# Patient Record
Sex: Female | Born: 1970 | Race: Black or African American | Hispanic: No | Smoking: Never smoker
Health system: Southern US, Community
[De-identification: ages and names within clinical notes are randomized; demographics above are authoritative.]

## PROBLEM LIST (undated history)

## (undated) DIAGNOSIS — I10 Essential (primary) hypertension: Secondary | ICD-10-CM

## (undated) HISTORY — PX: TONSILLECTOMY: SUR1361

## (undated) HISTORY — DX: Essential (primary) hypertension: I10

---

## 2000-12-03 ENCOUNTER — Other Ambulatory Visit: Admission: RE | Admit: 2000-12-03 | Discharge: 2000-12-03 | Payer: Self-pay | Admitting: Obstetrics and Gynecology

## 2001-02-03 ENCOUNTER — Encounter: Payer: Self-pay | Admitting: Emergency Medicine

## 2001-02-03 ENCOUNTER — Emergency Department (HOSPITAL_COMMUNITY): Admission: EM | Admit: 2001-02-03 | Discharge: 2001-02-03 | Payer: Self-pay | Admitting: Emergency Medicine

## 2001-07-28 ENCOUNTER — Encounter: Payer: Self-pay | Admitting: Family Medicine

## 2001-07-28 ENCOUNTER — Ambulatory Visit (HOSPITAL_COMMUNITY): Admission: RE | Admit: 2001-07-28 | Discharge: 2001-07-28 | Payer: Self-pay | Admitting: Family Medicine

## 2002-12-08 ENCOUNTER — Emergency Department (HOSPITAL_COMMUNITY): Admission: EM | Admit: 2002-12-08 | Discharge: 2002-12-08 | Payer: Self-pay | Admitting: Emergency Medicine

## 2004-01-30 ENCOUNTER — Emergency Department (HOSPITAL_COMMUNITY): Admission: EM | Admit: 2004-01-30 | Discharge: 2004-01-30 | Payer: Self-pay | Admitting: Emergency Medicine

## 2007-10-19 ENCOUNTER — Other Ambulatory Visit: Admission: RE | Admit: 2007-10-19 | Discharge: 2007-10-19 | Payer: Self-pay | Admitting: Obstetrics and Gynecology

## 2007-11-06 ENCOUNTER — Ambulatory Visit (HOSPITAL_COMMUNITY): Admission: RE | Admit: 2007-11-06 | Discharge: 2007-11-06 | Payer: Self-pay | Admitting: Orthopedic Surgery

## 2007-11-06 ENCOUNTER — Encounter: Payer: Self-pay | Admitting: Orthopedic Surgery

## 2009-02-09 ENCOUNTER — Other Ambulatory Visit: Admission: RE | Admit: 2009-02-09 | Discharge: 2009-02-09 | Payer: Self-pay | Admitting: Obstetrics and Gynecology

## 2009-03-13 ENCOUNTER — Ambulatory Visit: Payer: Self-pay | Admitting: Orthopedic Surgery

## 2009-03-13 DIAGNOSIS — M7020 Olecranon bursitis, unspecified elbow: Secondary | ICD-10-CM

## 2009-03-23 ENCOUNTER — Ambulatory Visit (HOSPITAL_COMMUNITY): Admission: RE | Admit: 2009-03-23 | Discharge: 2009-03-23 | Payer: Self-pay | Admitting: Obstetrics & Gynecology

## 2009-04-27 ENCOUNTER — Ambulatory Visit (HOSPITAL_COMMUNITY): Admission: RE | Admit: 2009-04-27 | Discharge: 2009-04-27 | Payer: Self-pay | Admitting: Obstetrics & Gynecology

## 2009-05-05 ENCOUNTER — Ambulatory Visit (HOSPITAL_COMMUNITY): Admission: RE | Admit: 2009-05-05 | Discharge: 2009-05-05 | Payer: Self-pay | Admitting: Obstetrics & Gynecology

## 2009-06-08 ENCOUNTER — Ambulatory Visit (HOSPITAL_COMMUNITY): Admission: RE | Admit: 2009-06-08 | Discharge: 2009-06-08 | Payer: Self-pay | Admitting: Obstetrics & Gynecology

## 2009-06-29 ENCOUNTER — Ambulatory Visit (HOSPITAL_COMMUNITY): Admission: RE | Admit: 2009-06-29 | Discharge: 2009-06-29 | Payer: Self-pay | Admitting: Obstetrics & Gynecology

## 2009-07-27 ENCOUNTER — Ambulatory Visit (HOSPITAL_COMMUNITY): Admission: RE | Admit: 2009-07-27 | Discharge: 2009-07-27 | Payer: Self-pay | Admitting: Obstetrics & Gynecology

## 2009-07-28 ENCOUNTER — Ambulatory Visit: Payer: Self-pay | Admitting: Family Medicine

## 2009-07-28 ENCOUNTER — Inpatient Hospital Stay (HOSPITAL_COMMUNITY): Admission: AD | Admit: 2009-07-28 | Discharge: 2009-07-28 | Payer: Self-pay | Admitting: Obstetrics & Gynecology

## 2009-08-16 ENCOUNTER — Ambulatory Visit (HOSPITAL_COMMUNITY): Admission: RE | Admit: 2009-08-16 | Discharge: 2009-08-16 | Payer: Self-pay | Admitting: Obstetrics & Gynecology

## 2009-09-11 ENCOUNTER — Ambulatory Visit: Payer: Self-pay | Admitting: Obstetrics & Gynecology

## 2009-09-11 ENCOUNTER — Inpatient Hospital Stay (HOSPITAL_COMMUNITY): Admission: AD | Admit: 2009-09-11 | Discharge: 2009-09-13 | Payer: Self-pay | Admitting: Obstetrics & Gynecology

## 2009-09-18 ENCOUNTER — Ambulatory Visit: Payer: Self-pay | Admitting: Family Medicine

## 2009-09-18 ENCOUNTER — Inpatient Hospital Stay (HOSPITAL_COMMUNITY): Admission: AD | Admit: 2009-09-18 | Discharge: 2009-09-21 | Payer: Self-pay | Admitting: Obstetrics and Gynecology

## 2010-09-16 ENCOUNTER — Encounter: Payer: Self-pay | Admitting: Obstetrics & Gynecology

## 2010-11-11 LAB — CREATININE CLEARANCE, URINE, 24 HOUR
Collection Interval-CRCL: 24 hours
Creatinine Clearance: 177 mL/min — ABNORMAL HIGH (ref 75–115)
Creatinine, Urine: 98.4 mg/dL
Creatinine: 0.58 mg/dL (ref 0.40–1.20)
Urine Total Volume-CRCL: 1500 mL

## 2010-11-11 LAB — PROTEIN, URINE, 24 HOUR
Collection Interval-UPROT: 24 hours
Protein, 24H Urine: 165 mg/d — ABNORMAL HIGH (ref 50–100)
Protein, Urine: 11 mg/dL
Urine Total Volume-UPROT: 1500 mL

## 2010-11-11 LAB — COMPREHENSIVE METABOLIC PANEL
ALT: 18 U/L (ref 0–35)
AST: 23 U/L (ref 0–37)
BUN: 5 mg/dL — ABNORMAL LOW (ref 6–23)
CO2: 26 mEq/L (ref 19–32)
Calcium: 8.9 mg/dL (ref 8.4–10.5)
Creatinine, Ser: 0.58 mg/dL (ref 0.4–1.2)
GFR calc non Af Amer: >60 mL/min (ref >60)
Glucose, Bld: 88 mg/dL (ref 70–99)
Sodium: 134 mEq/L — ABNORMAL LOW (ref 135–145)
Total Bilirubin: 0.5 mg/dL (ref 0.3–1.2)

## 2010-11-11 LAB — URIC ACID: Uric Acid, Serum: 5.2 mg/dL (ref 2.4–7.0)

## 2010-11-11 LAB — URINALYSIS, ROUTINE W REFLEX MICROSCOPIC
Glucose, UA: NEGATIVE mg/dL
Nitrite: NEGATIVE
Specific Gravity, Urine: 1.01 (ref 1.005–1.030)
pH: 8 (ref 5.0–8.0)

## 2010-11-11 LAB — CBC
Hemoglobin: 11.4 g/dL — ABNORMAL LOW (ref 12.0–15.0)
MCHC: 33.7 g/dL (ref 30.0–36.0)
Platelets: 255 10*3/uL (ref 150–400)
WBC: 7.4 10*3/uL (ref 4.0–10.5)

## 2010-11-11 LAB — URINE MICROSCOPIC-ADD ON

## 2010-11-27 LAB — URINALYSIS, ROUTINE W REFLEX MICROSCOPIC
Bilirubin Urine: NEGATIVE
Nitrite: NEGATIVE
Protein, ur: NEGATIVE mg/dL
Specific Gravity, Urine: 1.01 (ref 1.005–1.030)
pH: 7 (ref 5.0–8.0)

## 2010-11-27 LAB — WET PREP, GENITAL
Clue Cells Wet Prep HPF POC: NONE SEEN
Yeast Wet Prep HPF POC: NONE SEEN

## 2011-01-11 NOTE — Procedures (Signed)
   NAME:  Tara Rios, Tara Rios                          ACCOUNT NO.:  [id number]   MEDICAL RECORD NO.:  [id number]                   PATIENT TYPE:  EMS   LOCATION:  ED                                   FACILITY:  [facility]   PHYSICIAN:  Edward L. Juanetta Gosling, M.D.             DATE OF BIRTH:  01/09/1971   DATE OF PROCEDURE:  DATE OF DISCHARGE:  12/08/2002                                EKG INTERPRETATION   FINDINGS:  The rhythm is sinus rhythm with a tachycardic rate of about 101.   IMPRESSION:  Otherwise normal electrocardiogram.                                               Edward L. Juanetta Gosling, M.D.    ELH/MEDQ  D:  12/09/2002  T:  12/10/2002  Job:  [id number]

## 2011-01-14 ENCOUNTER — Encounter: Payer: Self-pay | Admitting: Obstetrics & Gynecology

## 2011-01-14 NOTE — Progress Notes (Unsigned)
  Tara Rios presents today for an ultrasound to evaluate placement of her intrauterine device/Mirena and evaluate for the presence of a left ovarian cyst. Her last ultrasound at the time of her IUD placement serendipitously revealed a 5 cm left ovarian cyst which appeared simple.  The patient states she has been having back pain associated with this as well as continuous daily bleeding really from the time she delivered her last child which has not abated since the placement of her IUD.  sound is performed today transvaginally and reveals a normal-sized uterus with the Mirena appropriately in the endometrial cavity. The right ovary reveals several small follicular cysts less than 10 mm, which are completely normal. The left ovary and the posterior and lay on top of the left hypogastric artery and vein at this point it appears to be a deflating cyst as it is somewhat tubular but does not appear to be a simple hydrosalpinx of the left lip into area and today it measures 3.66 x 3.2 cm has a thickened rim but a simple internal with no heterogeneous echoes.  There is no free fluid in the pelvis.  Impression  #1  dysfunctional uterine bleeding menometrorrhagia:  I'm going to place on estradiol tablets 2 mg daily to try and update her bleeding  #2  left ovarian cyst appears simple and potentially is involuting. I will see her back in 3 months to reevaluate with sonogram. I am hopeful it would shrink further and resolve.  However it persists we may use hormonal suppression with megestrol

## 2011-03-22 IMAGING — US US FETAL BPP W/O NONSTRESS
1 series · 14 of 28 positions shown · non-contrast
Comparison: none

OBSTETRICAL ULTRASOUND:
 This ultrasound exam was performed in the [HOSPITAL] Ultrasound Department.  The OB US report was generated in the AS system, and faxed to the ordering physician.  This report is also available in [HOSPITAL]?s Access[employer] and in [REDACTED] PACS.

[Series 1: us ob follow up · 0.25mm/px · 14 of 43 slices shown]
[im 2/43]
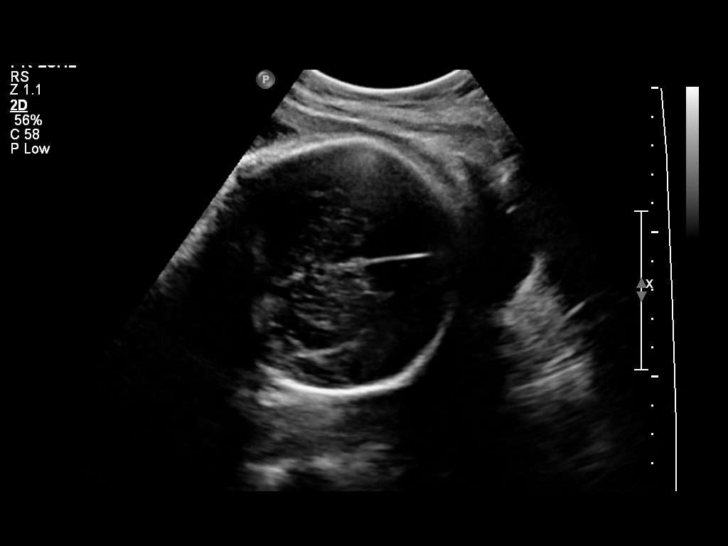
[im 5/43]
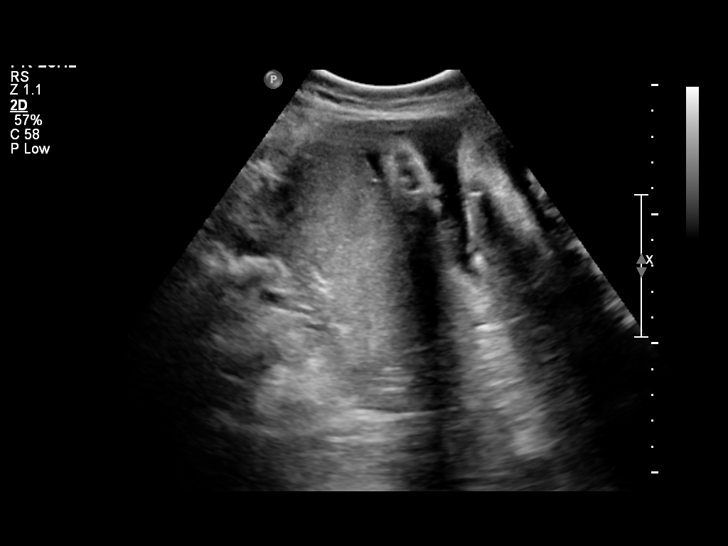
[im 8/43]
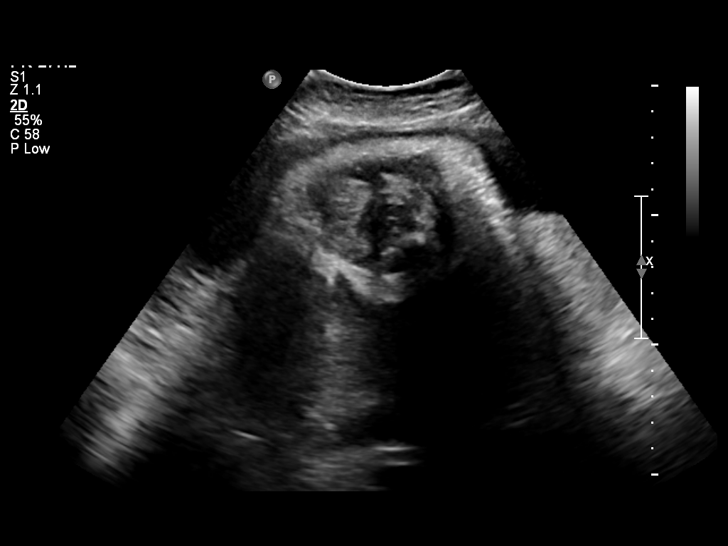
[im 11/43]
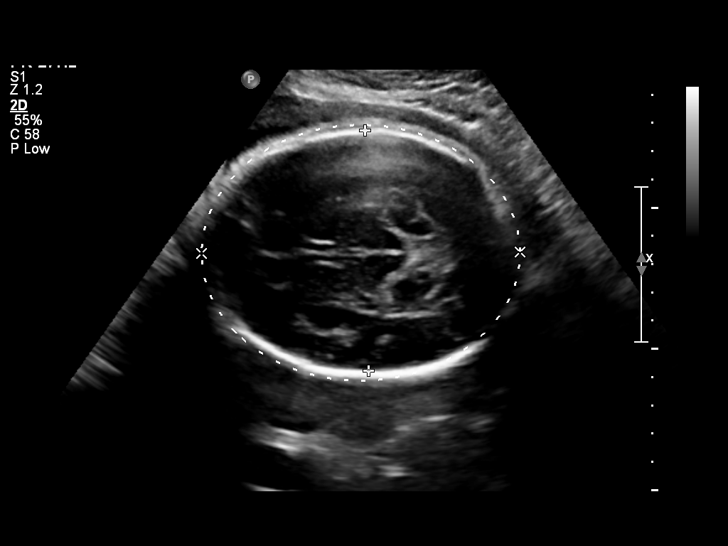
[im 15/43]
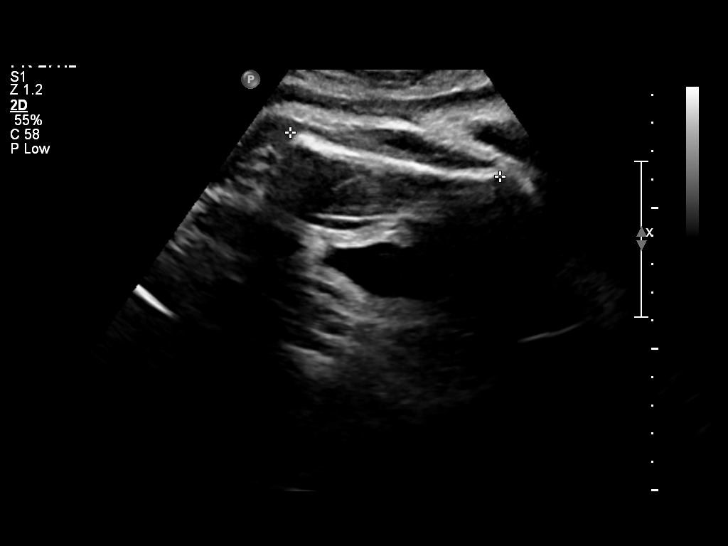
[im 18/43]
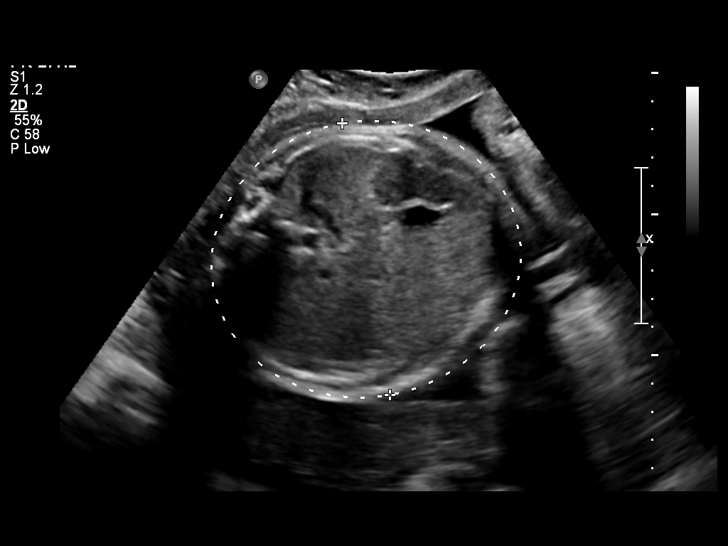
[im 21/43]
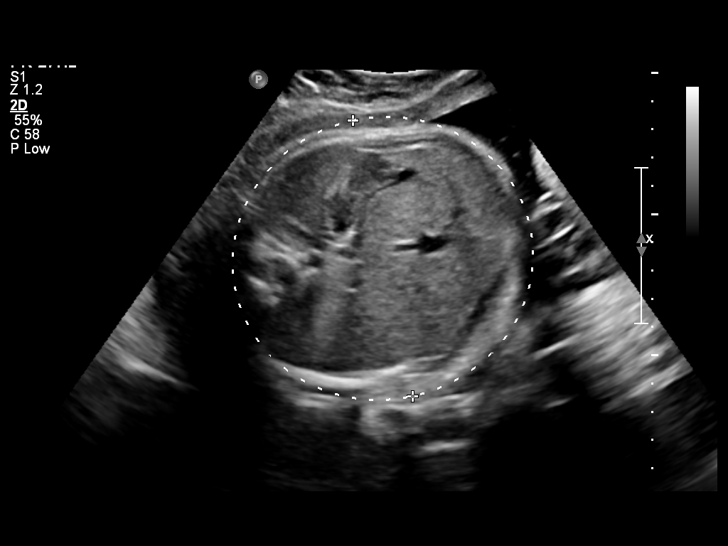
[im 24/43]
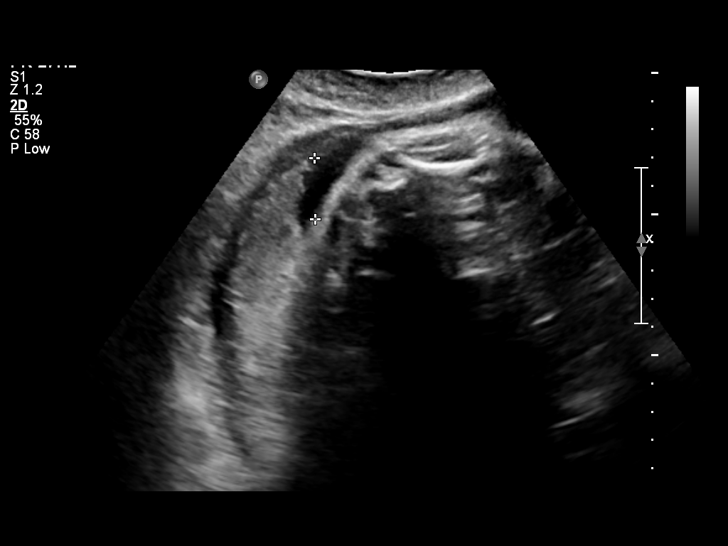
[im 27/43]
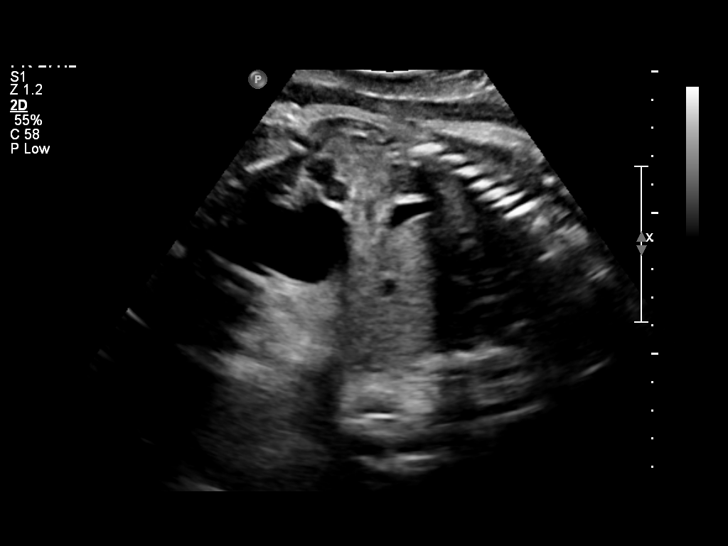
[im 30/43]
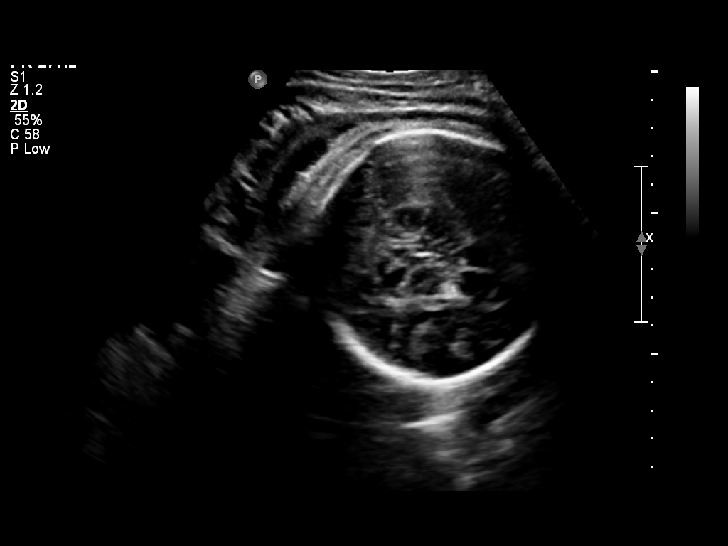
[im 33/43]
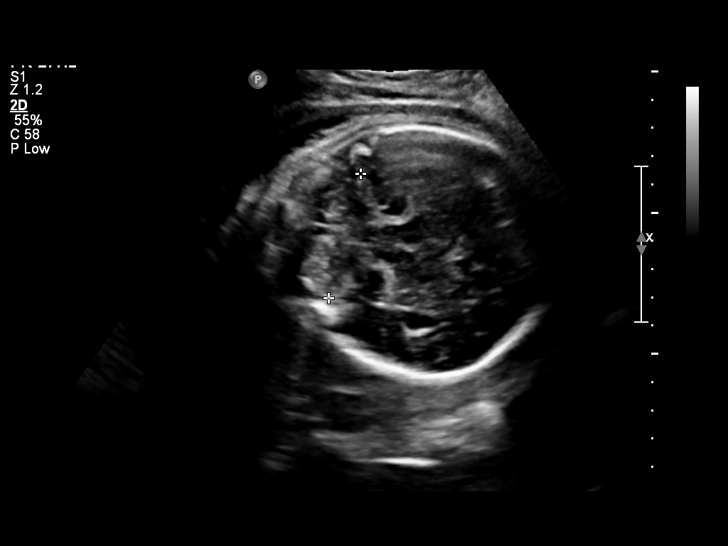
[im 36/43]
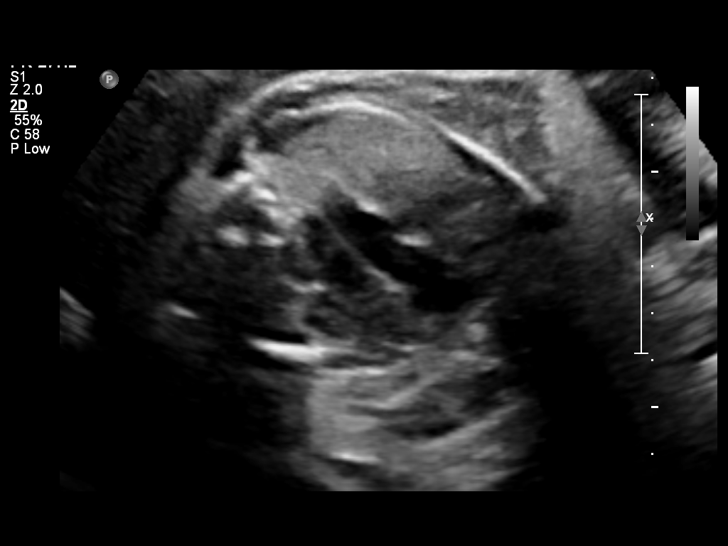
[im 39/43]
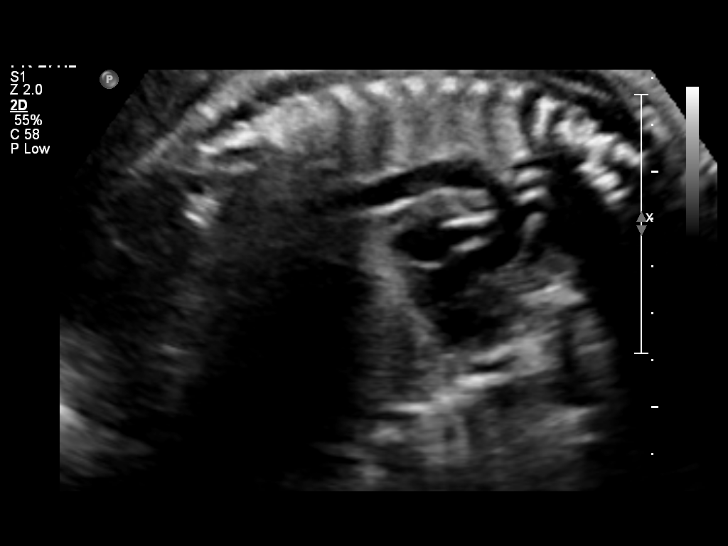
[im 43/43]
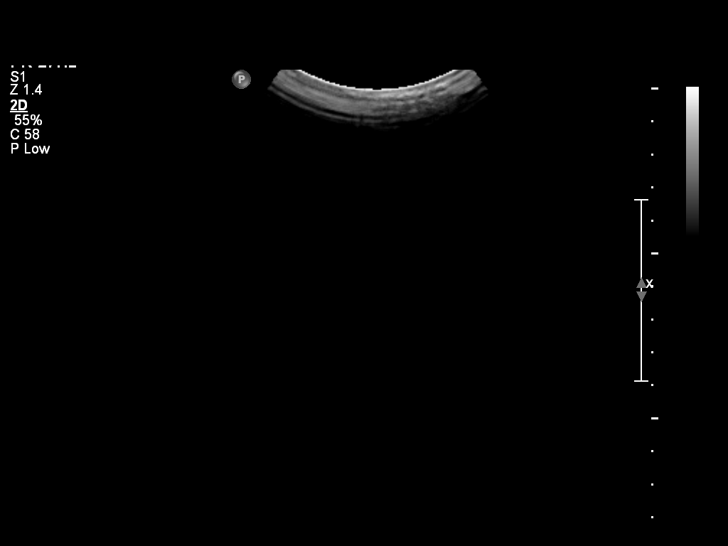

[14 of 28 positions shown; findings below may reference images not displayed]

IMPRESSION: See AS Obstetric US report.

## 2011-06-05 ENCOUNTER — Other Ambulatory Visit (HOSPITAL_COMMUNITY)
Admission: RE | Admit: 2011-06-05 | Discharge: 2011-06-05 | Disposition: A | Discharge status: Home / Self Care | Payer: 59 | Source: Ambulatory Visit | Attending: Obstetrics and Gynecology | Admitting: Obstetrics and Gynecology

## 2011-06-05 ENCOUNTER — Other Ambulatory Visit: Payer: Self-pay | Admitting: Obstetrics & Gynecology

## 2011-06-05 DIAGNOSIS — Z01419 Encounter for gynecological examination (general) (routine) without abnormal findings: Secondary | ICD-10-CM | POA: Insufficient documentation

## 2011-06-13 IMAGING — US US OB TRANSVAGINAL
1 series · 14 of 21 positions shown · non-contrast
Comparison: none

OBSTETRICAL ULTRASOUND:
 This ultrasound was performed in The [HOSPITAL], and the AS OB/GYN report will be stored to [REDACTED] PACS.

[Series 1: us ob transvaginal · 14 of 21 slices shown]
[im 1/21]
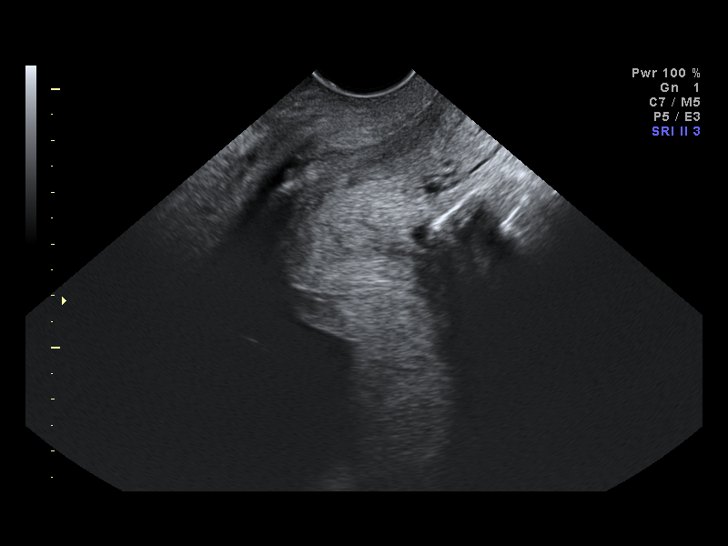
[im 3/21]
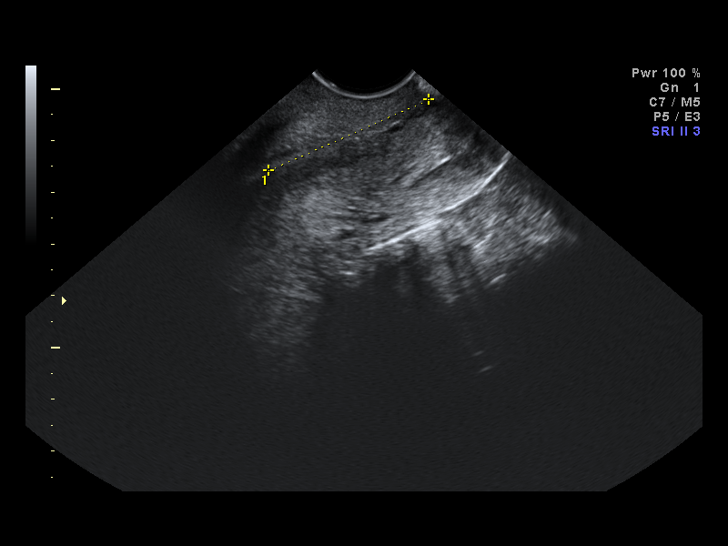
[im 4/21]
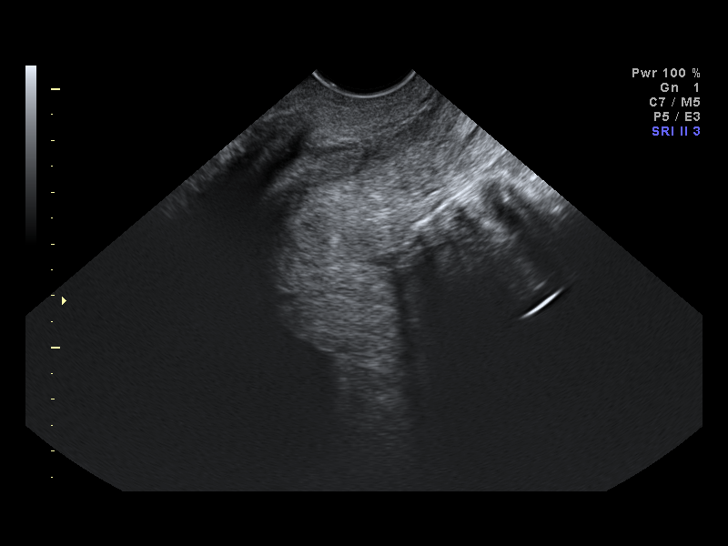
[im 6/21]
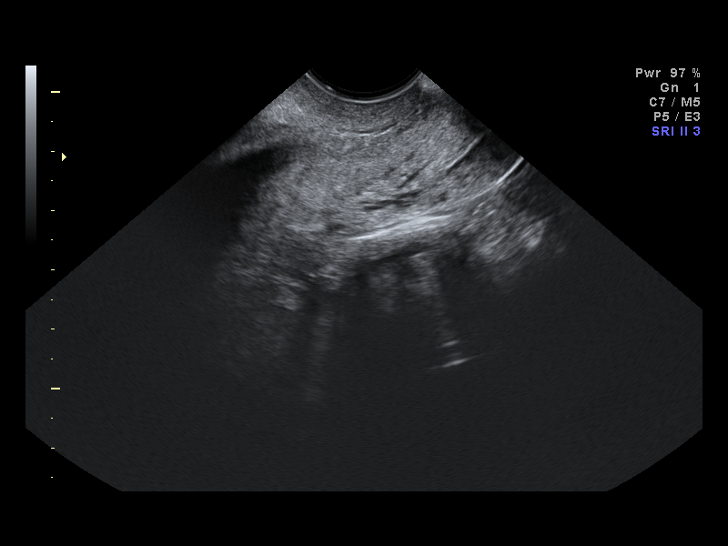
[im 7/21]
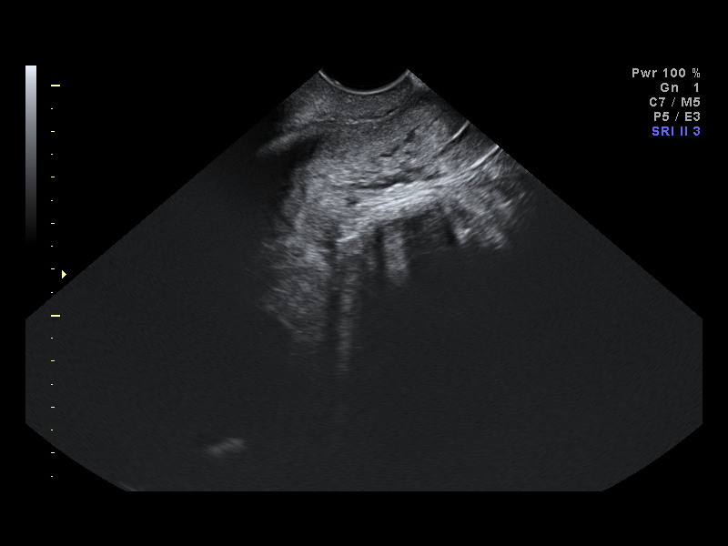
[im 9/21]
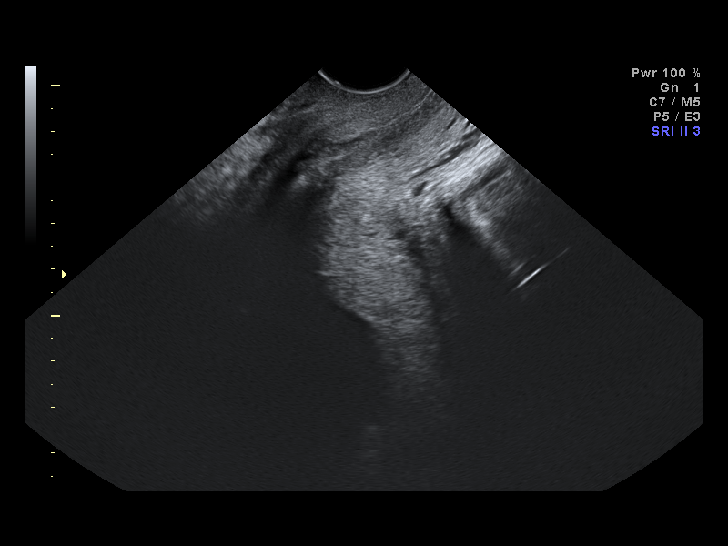
[im 10/21]
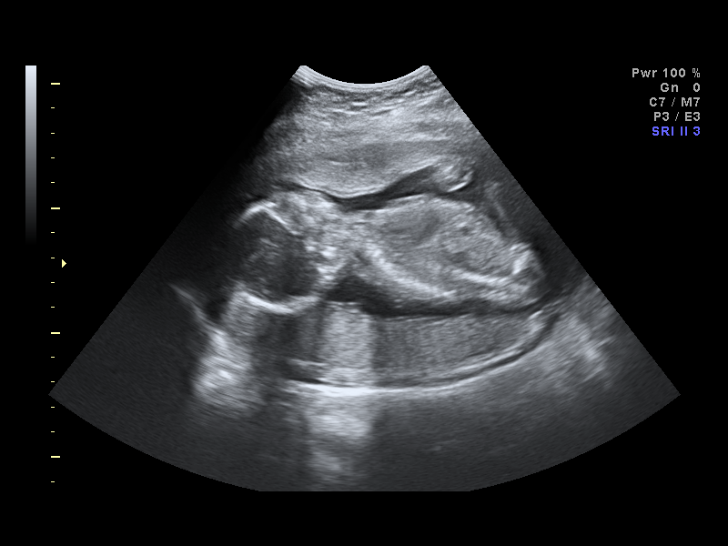
[im 12/21]
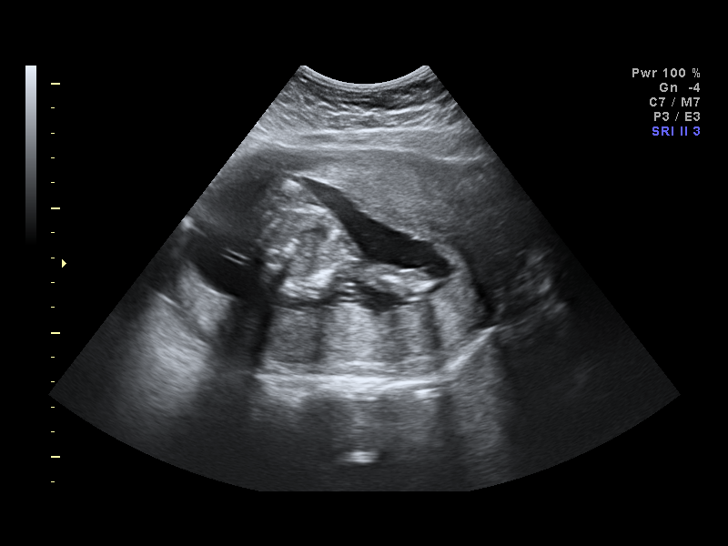
[im 13/21]
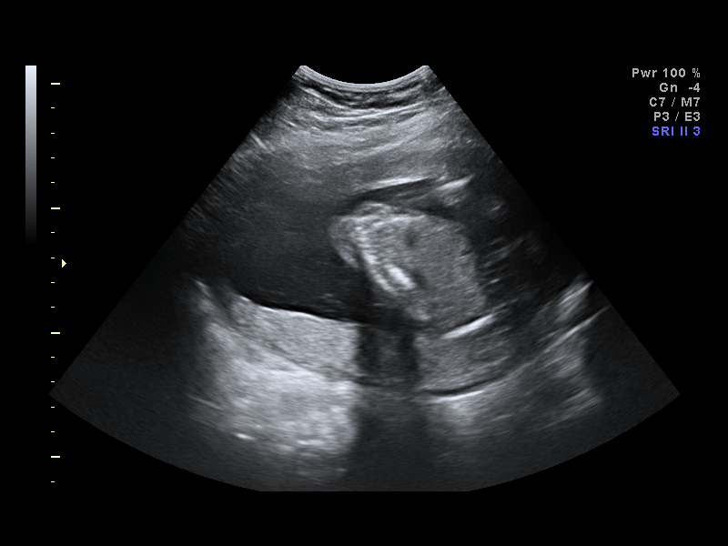
[im 15/21]
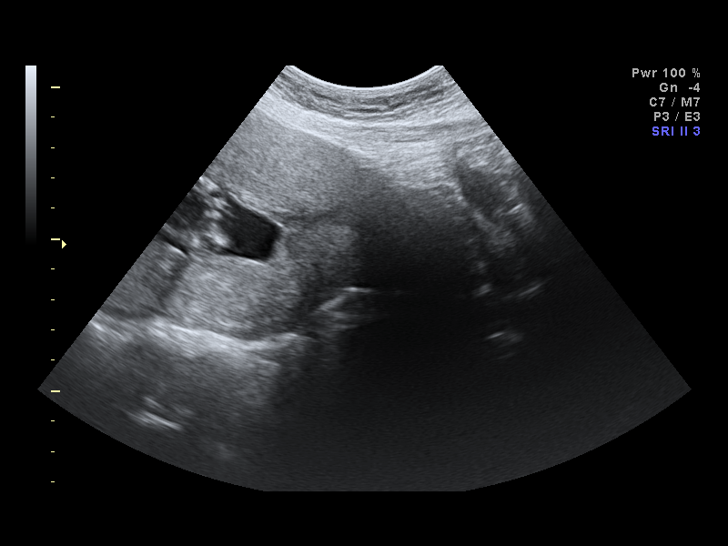
[im 16/21]
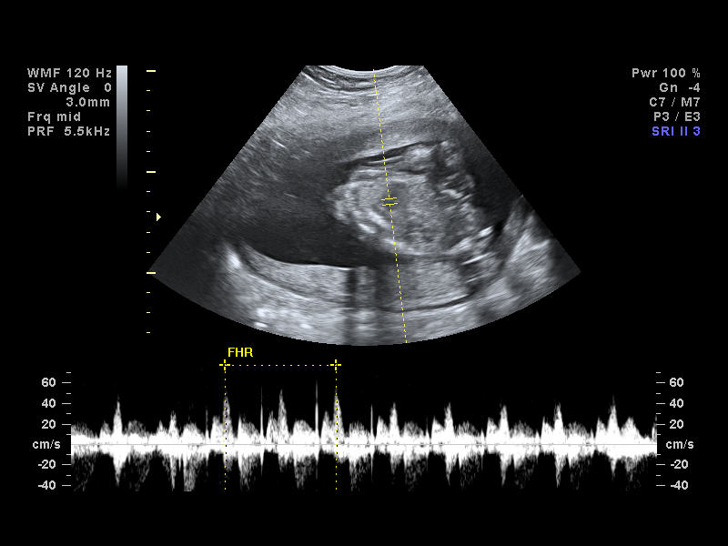
[im 18/21]
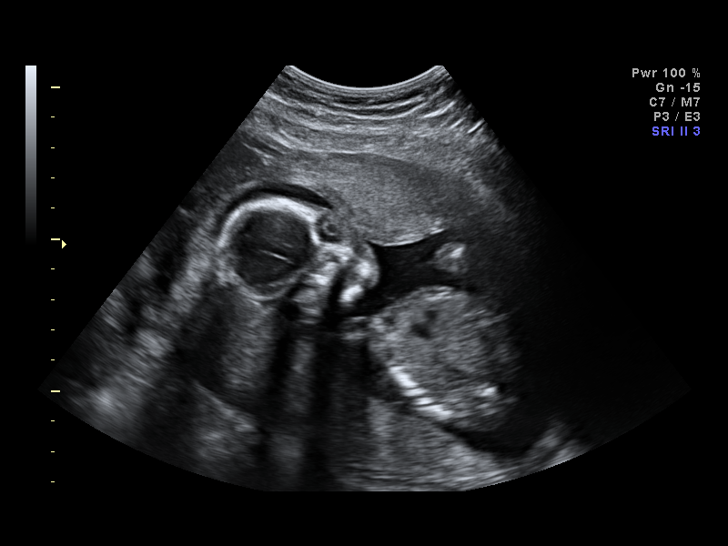
[im 19/21]
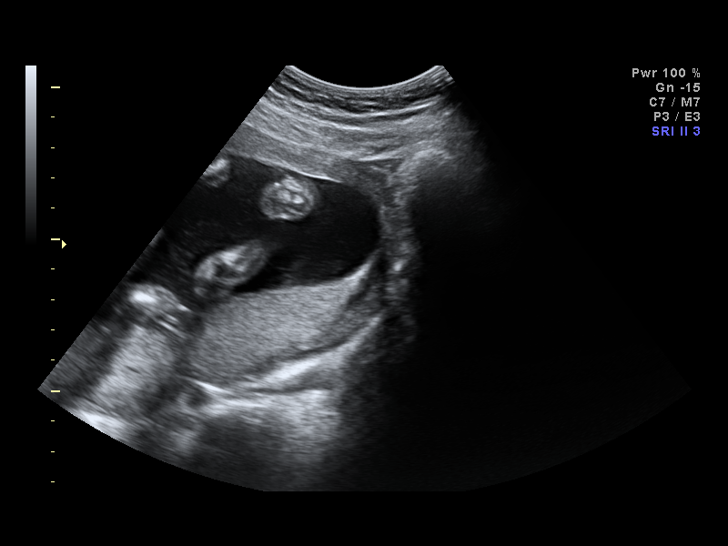
[im 21/21]
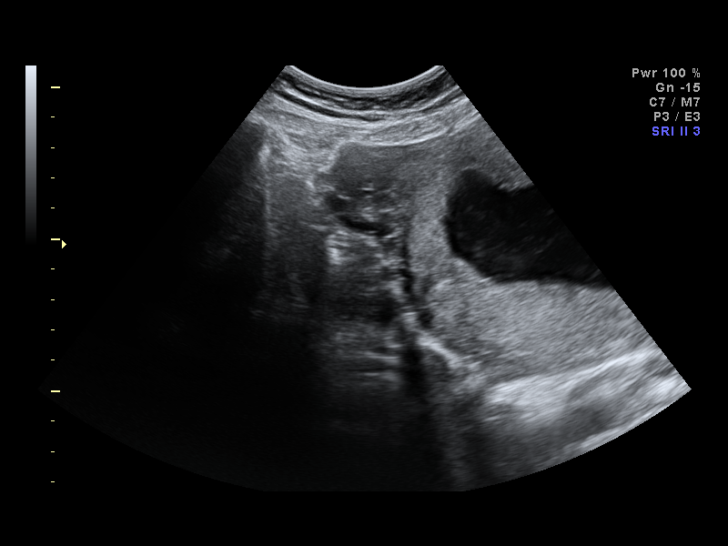

[14 of 21 positions shown; findings below may reference images not displayed]

IMPRESSION: AS OB/GYN has also been faxed to the ordering physician.

## 2011-07-04 ENCOUNTER — Other Ambulatory Visit (HOSPITAL_COMMUNITY): Payer: Self-pay | Admitting: Family Medicine

## 2011-07-04 DIAGNOSIS — Z139 Encounter for screening, unspecified: Secondary | ICD-10-CM

## 2011-07-11 ENCOUNTER — Ambulatory Visit (HOSPITAL_COMMUNITY)
Admission: RE | Admit: 2011-07-11 | Discharge: 2011-07-11 | Disposition: A | Discharge status: Home / Self Care | Payer: 59 | Source: Ambulatory Visit | Attending: Family Medicine | Admitting: Family Medicine

## 2011-07-11 DIAGNOSIS — Z1231 Encounter for screening mammogram for malignant neoplasm of breast: Secondary | ICD-10-CM | POA: Insufficient documentation

## 2011-07-11 DIAGNOSIS — Z139 Encounter for screening, unspecified: Secondary | ICD-10-CM

## 2012-08-27 ENCOUNTER — Other Ambulatory Visit (HOSPITAL_COMMUNITY): Payer: Self-pay | Admitting: Family Medicine

## 2012-08-27 DIAGNOSIS — Z139 Encounter for screening, unspecified: Secondary | ICD-10-CM

## 2012-09-03 ENCOUNTER — Ambulatory Visit (HOSPITAL_COMMUNITY)
Admission: RE | Admit: 2012-09-03 | Discharge: 2012-09-03 | Disposition: A | Discharge status: Home / Self Care | Payer: 59 | Source: Ambulatory Visit | Attending: Family Medicine | Admitting: Family Medicine

## 2012-09-03 DIAGNOSIS — Z1231 Encounter for screening mammogram for malignant neoplasm of breast: Secondary | ICD-10-CM | POA: Insufficient documentation

## 2012-09-03 DIAGNOSIS — Z139 Encounter for screening, unspecified: Secondary | ICD-10-CM

## 2013-10-27 ENCOUNTER — Other Ambulatory Visit (HOSPITAL_COMMUNITY): Payer: Self-pay | Admitting: Family Medicine

## 2013-10-27 DIAGNOSIS — Z1231 Encounter for screening mammogram for malignant neoplasm of breast: Secondary | ICD-10-CM

## 2013-11-01 ENCOUNTER — Ambulatory Visit (HOSPITAL_COMMUNITY)
Admission: RE | Admit: 2013-11-01 | Discharge: 2013-11-01 | Disposition: A | Discharge status: Home / Self Care | Payer: 59 | Source: Ambulatory Visit | Attending: Family Medicine | Admitting: Family Medicine

## 2013-11-01 DIAGNOSIS — Z1231 Encounter for screening mammogram for malignant neoplasm of breast: Secondary | ICD-10-CM | POA: Insufficient documentation

## 2013-11-08 ENCOUNTER — Other Ambulatory Visit (HOSPITAL_COMMUNITY): Payer: Self-pay | Admitting: General Practice

## 2013-11-08 ENCOUNTER — Ambulatory Visit (HOSPITAL_COMMUNITY)
Admission: RE | Admit: 2013-11-08 | Discharge: 2013-11-08 | Disposition: A | Discharge status: Home / Self Care | Payer: PRIVATE HEALTH INSURANCE | Source: Ambulatory Visit | Attending: General Practice | Admitting: General Practice

## 2013-11-08 DIAGNOSIS — R229 Localized swelling, mass and lump, unspecified: Secondary | ICD-10-CM | POA: Insufficient documentation

## 2013-11-08 DIAGNOSIS — S6990XA Unspecified injury of unspecified wrist, hand and finger(s), initial encounter: Secondary | ICD-10-CM

## 2015-01-03 ENCOUNTER — Ambulatory Visit (INDEPENDENT_AMBULATORY_CARE_PROVIDER_SITE_OTHER): Payer: 59 | Admitting: Advanced Practice Midwife

## 2015-01-03 ENCOUNTER — Other Ambulatory Visit (HOSPITAL_COMMUNITY)
Admission: RE | Admit: 2015-01-03 | Discharge: 2015-01-03 | Disposition: A | Discharge status: Home / Self Care | Payer: 59 | Source: Ambulatory Visit | Attending: Advanced Practice Midwife | Admitting: Advanced Practice Midwife

## 2015-01-03 ENCOUNTER — Encounter: Payer: Self-pay | Admitting: Advanced Practice Midwife

## 2015-01-03 VITALS — BP 156/100 | Ht 59.0 in | Wt 176.0 lb

## 2015-01-03 DIAGNOSIS — Z1151 Encounter for screening for human papillomavirus (HPV): Secondary | ICD-10-CM | POA: Insufficient documentation

## 2015-01-03 DIAGNOSIS — Z30432 Encounter for removal of intrauterine contraceptive device: Secondary | ICD-10-CM | POA: Diagnosis not present

## 2015-01-03 DIAGNOSIS — Z01419 Encounter for gynecological examination (general) (routine) without abnormal findings: Secondary | ICD-10-CM

## 2015-01-03 DIAGNOSIS — I10 Essential (primary) hypertension: Secondary | ICD-10-CM | POA: Insufficient documentation

## 2015-01-03 MED ORDER — ZOLPIDEM TARTRATE 5 MG PO TABS: 5.0000 mg | ORAL_TABLET | Freq: Every evening | ORAL | Status: DC | PRN

## 2015-01-03 NOTE — Progress Notes (Signed)
Tara Rios 44 y.o.  Filed Vitals:   01/03/15 0845  BP: 156/100     Past Medical History: Past Medical History  Diagnosis Date  . Hypertension     Past Surgical History: Past Surgical History  Procedure Laterality Date  . Tonsillectomy      Family History: Family History  Problem Relation Age of Onset  . Hypertension Mother   . Asthma Daughter   . ADD / ADHD Son   . Bipolar disorder Son     Social History: History  Substance Use Topics  . Smoking status: Never Smoker   . Smokeless tobacco: Never Used  . Alcohol Use: 0.0 oz/week     Comment: 1 glass of wine per month    Allergies:  Allergies  Allergen Reactions  . Sulfa Antibiotics   . Sulfonamide Derivatives       Current outpatient prescriptions:  .  labetalol (NORMODYNE) 200 MG tablet, Take 400 mg by mouth 3 (three) times daily.  , Disp: , Rfl:  .  zolpidem (AMBIEN) 5 MG tablet, Take 1 tablet (5 mg total) by mouth at bedtime as needed for sleep., Disp: 30 tablet, Rfl: 3  History of Present Illness: Here for pap and physical. Wants her mirena out--still spots, feels like she has a metallic smell. Wil probably use condoms when /if she has sex (with her ex)  She is divorced and has not had sex for 3 months. C/O not being able to get to sleep "my mind won't shut off: Benadryl hypes her up.     Review of Systems   Patient denies any headaches, blurred vision, shortness of breath, chest pain, abdominal pain, problems with bowel movements, urination, or intercourse.   Physical Exam: General:  Well developed, well nourished, no acute distress Skin:  Warm and dry Neck:  Midline trachea, normal thyroid Lungs; Clear to auscultation bilaterally Breast:  No dominant palpable mass, retraction, or nipple discharge Cardiovascular: Regular rate and rhythm Abdomen:  Soft, non tender, no hepatosplenomegaly Pelvic:  External genitalia is normal in appearance.  The vagina is normal in appearance.  The cervix is  bulbous. IUD removed without difficulty Uterus is felt to be normal size, shape, and contour.  No adnexal masses or tenderness noted.  Extremities:  No swelling or varicosities noted Psych:  No mood changes.     Impression: Normal GYN exam CHTN IUD removal Sleep disturbance Obesity    Plan: If pap normal, repeat q 3 years Rx ambien 10mg  for sleep.  Look into sleep hygiene Low carb, start exercising [facility]oms for [facility]

## 2015-01-03 NOTE — Patient Instructions (Signed)
Investigate sleep hygiene

## 2015-01-05 LAB — CYTOLOGY - PAP

## 2015-02-16 ENCOUNTER — Other Ambulatory Visit: Payer: Self-pay | Admitting: Advanced Practice Midwife

## 2015-02-16 DIAGNOSIS — Z1231 Encounter for screening mammogram for malignant neoplasm of breast: Secondary | ICD-10-CM

## 2015-03-01 ENCOUNTER — Ambulatory Visit (HOSPITAL_COMMUNITY)
Admission: RE | Admit: 2015-03-01 | Discharge: 2015-03-01 | Disposition: A | Discharge status: Home / Self Care | Payer: 59 | Source: Ambulatory Visit | Attending: Advanced Practice Midwife | Admitting: Advanced Practice Midwife

## 2015-03-01 DIAGNOSIS — Z1231 Encounter for screening mammogram for malignant neoplasm of breast: Secondary | ICD-10-CM | POA: Diagnosis not present

## 2015-04-24 ENCOUNTER — Other Ambulatory Visit: Payer: Self-pay | Admitting: Advanced Practice Midwife

## 2015-08-14 ENCOUNTER — Other Ambulatory Visit: Payer: Self-pay | Admitting: Advanced Practice Midwife

## 2015-09-11 DIAGNOSIS — I10 Essential (primary) hypertension: Secondary | ICD-10-CM | POA: Diagnosis not present

## 2015-09-11 DIAGNOSIS — R55 Syncope and collapse: Secondary | ICD-10-CM | POA: Diagnosis not present

## 2015-09-11 DIAGNOSIS — R42 Dizziness and giddiness: Secondary | ICD-10-CM | POA: Diagnosis not present

## 2015-09-11 MED FILL — ZOLPIDEM TARTRATE 5 MG TAB: 5 | 30 days supply | Qty: 30 | Fill #1

## 2015-10-09 MED FILL — ZOLPIDEM TARTRATE 5 MG TAB: 5 | 30 days supply | Qty: 30 | Fill #2

## 2015-10-10 DIAGNOSIS — R55 Syncope and collapse: Secondary | ICD-10-CM | POA: Diagnosis not present

## 2015-10-26 ENCOUNTER — Encounter (HOSPITAL_COMMUNITY): Payer: Self-pay

## 2015-10-26 ENCOUNTER — Inpatient Hospital Stay (HOSPITAL_COMMUNITY): Admission: RE | Admit: 2015-10-26 | Payer: Self-pay | Source: Ambulatory Visit

## 2015-10-26 ENCOUNTER — Other Ambulatory Visit (HOSPITAL_COMMUNITY): Payer: Self-pay

## 2015-11-06 ENCOUNTER — Ambulatory Visit (HOSPITAL_BASED_OUTPATIENT_CLINIC_OR_DEPARTMENT_OTHER)
Admission: RE | Admit: 2015-11-06 | Discharge: 2015-11-06 | Disposition: A | Discharge status: Home / Self Care | Payer: 59 | Source: Ambulatory Visit | Attending: Cardiology | Admitting: Cardiology

## 2015-11-06 ENCOUNTER — Ambulatory Visit (HOSPITAL_COMMUNITY)
Admission: RE | Admit: 2015-11-06 | Discharge: 2015-11-06 | Disposition: A | Discharge status: Home / Self Care | Payer: 59 | Source: Ambulatory Visit | Attending: Cardiology | Admitting: Cardiology

## 2015-11-06 DIAGNOSIS — R55 Syncope and collapse: Secondary | ICD-10-CM | POA: Insufficient documentation

## 2015-11-06 DIAGNOSIS — R079 Chest pain, unspecified: Secondary | ICD-10-CM

## 2015-11-06 LAB — ECHOCARDIOGRAM COMPLETE
CHL CUP AORTIC ROOT 2D: 32 mm
CHL CUP LA SIZE INDEX: 1.89 mm/m2
CHL CUP LA VOL 2D INDEX: 21.5 mL/m2
E decel time: 250 msec
E/e' ratio: 10.36
FS: 37 % (ref 28–44)
IVS/LV PW RATIO, ED: 0.99
LA ID, A-P, ES: 33 mm
LA VOL 2D: 37.6 mL
LA vol: 39.6 mL
LAVOLIN: 22.6 mL/m2
LEFT ATRIUM END SYS DIAM: 33 mm
LV PW d: 10.2 mm — AB (ref 0.6–1.1)
LV TDI E'MEDIAL: 9.03 cm/s
LV dias vol: 52 mL (ref 46–106)
LV sys vol index: 9 mL/m2
LVDIAVOLIN: 30 mL/m2
LVIDD: 38 mm — AB (ref 3.5–6.0)
LVIDS: 23.8 mm — AB (ref 2.1–4.0)
LVOT area: 2.54 cm2
LVOT diameter: 18 mm
LVSYSVOL: 52 mL — AB (ref 14–42)
MV Dec: 250 ms
MV pk A vel: 93.6 cm/s
MVPG: 5 mmHg
MVPKEVEL: 114 cm/s
PWSYS: 10.2 mm
SV INDEX: 20.5 mL/m2
Simpson's disk: 69
Single Plane A4C EF: 71 %
Stroke v: 36 ml
TAPSE: 23 mm
TDI e' lateral: 11 cm/s
TRMAXVEL: 238 cm/s
TV PEAK RV-RA GRADIENT: 23 cm/s

## 2015-11-06 LAB — EXERCISE TOLERANCE TEST
CHL CUP MPHR: 176 {beats}/min
CSEPED: 6 min
CSEPEDS: 12 s
CSEPEW: 7.6 METS
CSEPPHR: 151 {beats}/min
Percent HR: 85 %
RPE: 11
Rest HR: 64 {beats}/min

## 2015-11-06 MED FILL — ZOLPIDEM TARTRATE 5 MG TAB: 5 | 30 days supply | Qty: 30 | Fill #3

## 2015-11-09 DIAGNOSIS — H524 Presbyopia: Secondary | ICD-10-CM | POA: Diagnosis not present

## 2015-11-09 DIAGNOSIS — H52223 Regular astigmatism, bilateral: Secondary | ICD-10-CM | POA: Diagnosis not present

## 2015-11-30 ENCOUNTER — Other Ambulatory Visit: Payer: Self-pay | Admitting: Advanced Practice Midwife

## 2015-12-04 MED FILL — ZOLPIDEM TARTRATE 5 MG TAB: 5 | 30 days supply | Qty: 30 | Fill #0

## 2016-01-04 MED FILL — ZOLPIDEM TARTRATE 5 MG TAB: 5 | 30 days supply | Qty: 30 | Fill #1

## 2016-01-11 MED FILL — LABETALOL HCL 200 MG TABLET: 200 | 30 days supply | Qty: 90 | Fill #0

## 2016-01-13 DIAGNOSIS — I10 Essential (primary) hypertension: Secondary | ICD-10-CM | POA: Diagnosis not present

## 2016-02-07 MED FILL — ZOLPIDEM TARTRATE 5 MG TAB: 5 | 30 days supply | Qty: 30 | Fill #2

## 2016-02-08 DIAGNOSIS — I10 Essential (primary) hypertension: Secondary | ICD-10-CM | POA: Diagnosis not present

## 2016-03-06 MED FILL — LABETALOL HCL 200 MG TABLET: 200 | 30 days supply | Qty: 90 | Fill #1

## 2016-03-06 MED FILL — ZOLPIDEM TARTRATE 5 MG TAB: 5 | 30 days supply | Qty: 30 | Fill #3

## 2016-04-08 MED FILL — LABETALOL HCL 200 MG TABLET: 200 | 30 days supply | Qty: 90 | Fill #0

## 2016-04-08 MED FILL — EDARBYCLOR 40-12.5 MG TAB: 40-12.5 | 30 days supply | Qty: 30 | Fill #0

## 2016-04-09 ENCOUNTER — Other Ambulatory Visit: Payer: Self-pay | Admitting: Advanced Practice Midwife

## 2016-04-09 MED FILL — ZOLPIDEM TARTRATE 5 MG TAB: 5 | 30 days supply | Qty: 30 | Fill #0

## 2016-04-10 DIAGNOSIS — I10 Essential (primary) hypertension: Secondary | ICD-10-CM | POA: Diagnosis not present

## 2016-04-10 DIAGNOSIS — R51 Headache: Secondary | ICD-10-CM | POA: Diagnosis not present

## 2016-04-10 MED FILL — ACETAMINOPHEN/COD #3 TABLET: 300-30 | 3 days supply | Qty: 15 | Fill #0

## 2016-05-07 MED FILL — ZOLPIDEM TARTRATE 5 MG TAB: 5 | 30 days supply | Qty: 30 | Fill #1

## 2016-05-21 MED FILL — EDARBYCLOR 40-12.5 MG TAB: 40-12.5 | 30 days supply | Qty: 30 | Fill #1

## 2016-05-21 MED FILL — LABETALOL HCL 200 MG TABLET: 200 | 30 days supply | Qty: 90 | Fill #1

## 2016-06-04 MED FILL — ZOLPIDEM TARTRATE 5 MG TAB: 5 | 30 days supply | Qty: 30 | Fill #2

## 2016-06-27 MED FILL — LABETALOL HCL 200 MG TABLET: 200 | 30 days supply | Qty: 90 | Fill #2

## 2016-06-27 MED FILL — EDARBYCLOR 40-12.5 MG TAB: 40-12.5 | 30 days supply | Qty: 30 | Fill #2

## 2016-07-04 MED FILL — ZOLPIDEM TARTRATE 5 MG TAB: 5 | 30 days supply | Qty: 30 | Fill #3

## 2016-07-31 MED FILL — EDARBYCLOR 40-12.5 MG TAB: 40-12.5 | 30 days supply | Qty: 30 | Fill #3

## 2016-07-31 MED FILL — LABETALOL HCL 200 MG TABLET: 200 | 30 days supply | Qty: 90 | Fill #3

## 2016-08-06 ENCOUNTER — Other Ambulatory Visit: Payer: Self-pay | Admitting: Advanced Practice Midwife

## 2016-08-07 MED FILL — ZOLPIDEM TARTRATE 5 MG TAB: 5 | 30 days supply | Qty: 30 | Fill #0

## 2016-09-03 MED FILL — EDARBYCLOR 40-12.5 MG TAB: 40-12.5 | 30 days supply | Qty: 30 | Fill #0

## 2016-09-04 MED FILL — ZOLPIDEM TARTRATE 5 MG TAB: 5 | 30 days supply | Qty: 30 | Fill #1

## 2016-09-09 DIAGNOSIS — N390 Urinary tract infection, site not specified: Secondary | ICD-10-CM | POA: Diagnosis not present

## 2016-09-09 DIAGNOSIS — I10 Essential (primary) hypertension: Secondary | ICD-10-CM | POA: Diagnosis not present

## 2016-09-09 DIAGNOSIS — Z23 Encounter for immunization: Secondary | ICD-10-CM | POA: Diagnosis not present

## 2016-09-09 MED FILL — LABETALOL HCL 200 MG TABLET: 200 | 30 days supply | Qty: 90 | Fill #0

## 2016-09-16 ENCOUNTER — Other Ambulatory Visit: Payer: Self-pay | Admitting: Family Medicine

## 2016-09-16 DIAGNOSIS — Z1231 Encounter for screening mammogram for malignant neoplasm of breast: Secondary | ICD-10-CM

## 2016-10-03 ENCOUNTER — Other Ambulatory Visit: Payer: 59 | Admitting: Advanced Practice Midwife

## 2016-10-03 MED FILL — EDARBYCLOR 40-12.5 MG TAB: 40-12.5 | 30 days supply | Qty: 30 | Fill #1

## 2016-10-03 MED FILL — ZOLPIDEM TARTRATE 5 MG TAB: 5 | 30 days supply | Qty: 30 | Fill #2

## 2016-10-07 ENCOUNTER — Ambulatory Visit
Admission: RE | Admit: 2016-10-07 | Discharge: 2016-10-07 | Disposition: A | Discharge status: Home / Self Care | Payer: 59 | Source: Ambulatory Visit | Attending: Family Medicine | Admitting: Family Medicine

## 2016-10-07 DIAGNOSIS — Z1231 Encounter for screening mammogram for malignant neoplasm of breast: Secondary | ICD-10-CM | POA: Diagnosis not present

## 2016-10-09 ENCOUNTER — Other Ambulatory Visit: Payer: Self-pay | Admitting: Family Medicine

## 2016-10-09 DIAGNOSIS — R928 Other abnormal and inconclusive findings on diagnostic imaging of breast: Secondary | ICD-10-CM

## 2016-10-10 ENCOUNTER — Ambulatory Visit (INDEPENDENT_AMBULATORY_CARE_PROVIDER_SITE_OTHER): Payer: 59 | Admitting: Advanced Practice Midwife

## 2016-10-10 ENCOUNTER — Encounter: Payer: Self-pay | Admitting: Advanced Practice Midwife

## 2016-10-10 VITALS — BP 102/64 | HR 81 | Ht 59.0 in | Wt 158.8 lb

## 2016-10-10 DIAGNOSIS — Z01419 Encounter for gynecological examination (general) (routine) without abnormal findings: Secondary | ICD-10-CM

## 2016-10-10 MED ORDER — ZOLPIDEM TARTRATE 5 MG PO TABS: 5.0000 mg | ORAL_TABLET | Freq: Every evening | ORAL | 3 refills | Status: DC | PRN

## 2016-10-10 NOTE — Progress Notes (Signed)
Tara Rios 46 y.o.  Vitals:   10/10/16 1100  BP: 102/64  Pulse: 81     Filed Weights   10/10/16 1100  Weight: 158 lb 12.8 oz (72 kg)    Past Medical History: Past Medical History:  Diagnosis Date  . Hypertension     Past Surgical History: Past Surgical History:  Procedure Laterality Date  . TONSILLECTOMY      Family History: Family History  Problem Relation Age of Onset  . Hypertension Mother   . Asthma Daughter   . ADD / ADHD Son   . Bipolar disorder Son     Social History: Social History  Substance Use Topics  . Smoking status: Never Smoker  . Smokeless tobacco: Never Used  . Alcohol use 0.0 oz/week     Comment: occassional    Allergies:  Allergies  Allergen Reactions  . Sulfa Antibiotics   . Sulfonamide Derivatives       Current Outpatient Prescriptions:  .  Azilsartan-Chlorthalidone (EDARBYCLOR PO), Take 150 mg by mouth at bedtime., Disp: , Rfl:  .  labetalol (NORMODYNE) 200 MG tablet, Take 400 mg by mouth 2 (two) times daily. , Disp: , Rfl:  .  zolpidem (AMBIEN) 5 MG tablet, Take 1 tablet (5 mg total) by mouth at bedtime as needed. for sleep, Disp: 30 tablet, Rfl: 3  History of Present Illness: Here for physical.  Had pap 5/16, normal.  "Talking" to a new guy, hasn't had sex yet. Offered BC/STD testing, declined.  Encouraged both!  [facility] May (conflict there).  Has several [facility]ins[facility] companies[facility]  Has to have a repeat mammogram on Friday, but reason is vague (mass?? Vs poor pictures).    Review of Systems   Patient denies any headaches, blurred vision, shortness of breath, chest pain, abdominal pain, problems with bowel movements, urination, or intercourse.   Physical Exam: General:  Well developed, well nourished, no acute distress Skin:  Warm and dry Neck:  Midline trachea, normal thyroid Lungs; Clear to auscultation bilaterally Breast:  No dominant palpable mass, retraction, or nipple  discharge Cardiovascular: Regular rate and rhythm Abdomen:  Soft, non tender, no hepatosplenomegaly Pelvic:  External genitalia is normal in appearance.  The vagina is normal in appearance.  The cervix is bulbous.  Uterus is felt to be normal size, shape, and contour.  No adnexal masses or tenderness noted.  Extremities:  No swelling or varicosities noted Psych:  No mood changes.     Impression: normal gyn exam Repeat mammogram, ? Mass left breast     Plan: pap due 5/19

## 2016-10-14 ENCOUNTER — Ambulatory Visit
Admission: RE | Admit: 2016-10-14 | Discharge: 2016-10-14 | Disposition: A | Discharge status: Home / Self Care | Payer: 59 | Source: Ambulatory Visit | Attending: Family Medicine | Admitting: Family Medicine

## 2016-10-14 DIAGNOSIS — N6002 Solitary cyst of left breast: Secondary | ICD-10-CM | POA: Diagnosis not present

## 2016-10-14 DIAGNOSIS — R928 Other abnormal and inconclusive findings on diagnostic imaging of breast: Secondary | ICD-10-CM

## 2016-10-14 DIAGNOSIS — N6320 Unspecified lump in the left breast, unspecified quadrant: Secondary | ICD-10-CM | POA: Diagnosis not present

## 2016-10-31 MED FILL — ZOLPIDEM TARTRATE 5 MG TAB: 5 | 30 days supply | Qty: 30 | Fill #3

## 2016-10-31 MED FILL — LABETALOL HCL 200 MG TABLET: 200 | 30 days supply | Qty: 90 | Fill #1

## 2016-11-01 MED FILL — EDARBYCLOR 40-12.5 MG TAB: 40-12.5 | 30 days supply | Qty: 30 | Fill #2

## 2016-12-02 ENCOUNTER — Other Ambulatory Visit: Payer: Self-pay | Admitting: *Deleted

## 2016-12-03 MED ORDER — ZOLPIDEM TARTRATE 5 MG PO TABS: 5.0000 mg | ORAL_TABLET | Freq: Every evening | ORAL | 3 refills | Status: DC | PRN

## 2017-01-02 ENCOUNTER — Other Ambulatory Visit: Payer: 59 | Admitting: Adult Health

## 2017-05-15 ENCOUNTER — Encounter: Payer: Self-pay | Admitting: Women's Health

## 2017-05-15 ENCOUNTER — Ambulatory Visit (INDEPENDENT_AMBULATORY_CARE_PROVIDER_SITE_OTHER): Payer: No Typology Code available for payment source | Admitting: Women's Health

## 2017-05-15 VITALS — BP 122/78 | HR 75 | Ht 59.0 in | Wt 164.0 lb

## 2017-05-15 DIAGNOSIS — A599 Trichomoniasis, unspecified: Secondary | ICD-10-CM

## 2017-05-15 DIAGNOSIS — Z113 Encounter for screening for infections with a predominantly sexual mode of transmission: Secondary | ICD-10-CM | POA: Diagnosis not present

## 2017-05-15 DIAGNOSIS — N898 Other specified noninflammatory disorders of vagina: Secondary | ICD-10-CM | POA: Diagnosis not present

## 2017-05-15 LAB — POCT WET PREP (WET MOUNT): Clue Cells Wet Prep Whiff POC: POSITIVE

## 2017-05-15 MED ORDER — METRONIDAZOLE 500 MG PO TABS: 500.0000 mg | ORAL_TABLET | Freq: Two times a day (BID) | ORAL | 0 refills | Status: DC

## 2017-05-15 NOTE — Patient Instructions (Signed)

## 2017-05-15 NOTE — Progress Notes (Signed)
   [facility] Visit  Patient name: Tara Rios MRN [id number]  Date of birth: 1971-07-15 CC & HPI:  Tara Rios is a 46 y.o. G3P3 African American female being seen today for report of vaginal discharge and itching with slight odor x 2 weeks. Uses condom for contraception/std prevention, it slipped off inside of her as he was pulling out after he ejaculated once a few months ago. Tried a boric acid suppository thinking it was BV, helped some but didn't completely resolve sx. Had trichomonas years back, was treated. Had neg poc. Is no longer with that partner. Thinking about getting on contraception- wants to think about it some more. Discussed options.   Patient's last menstrual period was 04/23/2017. The current method of family planning is condoms always. Last pap 01/03/15 neg w/ -HRHPV  Review of Systems:   Patient denies any headaches, hearing loss, fatigue, blurred vision, shortness of breath, chest pain, abdominal pain, problems with bowel movements, urination, or intercourse. No joint pain or mood swings.  Pertinent History Reviewed:  Reviewed past medical,surgical and family history.  Reviewed problem list, medications and allergies.  Objective Findings:   Vitals:   05/15/17 1624  BP: 122/78  Pulse: 75  Weight: 164 lb (74.4 kg)  Height:  (1.499 m)    Body mass index is 33.12 kg/m.  Physical Examination: General appearance - well appearing, and in no distress Mental status - alert, oriented to person, place, and time Physical Examination: General appearance - alert, well appearing, and in no distress Pelvic - thin yellow malodorous d/c present on labia, cx appears normal, mod amt yellow thin malodorous d/c  Results for orders placed or performed in visit on 05/15/17 (from the past 24 hour(s))  POCT Wet Prep Tara Rios)   Collection Time: 05/15/17  5:03 PM  Result Value Ref Range   Source Wet Prep POC vaginal    WBC, Wet Prep HPF POC many    Bacteria Wet Prep HPF POC None (A) Few   BACTERIA WET PREP MORPHOLOGY POC     Clue Cells Wet Prep HPF POC None None   Clue Cells Wet Prep Whiff POC Positive Whiff    Yeast Wet Prep HPF POC None    KOH Wet Prep POC     Trichomonas Wet Prep HPF POC Present (A) Absent     Assessment & Plan:   1) Trichomonas> rx metronidazole  BID x 7d for pt and partner Tara Rios DOB 08[birth year]/74, NKDA- called his into [facility] [place]. No etoh while taking. No sex until at least 7d from both finishing medicine. POC in 3wks.  2) STD screen> gc/ct today 3) Contraception counseling  Orders Placed This Encounter  Procedures  . GC/Chlamydia Probe Amp  . POCT Wet Prep [facility])    Return in about 3 weeks (around 06/05/2017) for F/U.  Marge Duncans CNM, [facility] 05/15/2017 5:14 PM

## 2017-05-19 LAB — GC/CHLAMYDIA PROBE AMP
Chlamydia trachomatis, NAA: NEGATIVE
Neisseria gonorrhoeae by PCR: NEGATIVE

## 2017-06-05 ENCOUNTER — Encounter: Payer: Self-pay | Admitting: Women's Health

## 2017-06-05 ENCOUNTER — Ambulatory Visit (INDEPENDENT_AMBULATORY_CARE_PROVIDER_SITE_OTHER): Payer: No Typology Code available for payment source | Admitting: Women's Health

## 2017-06-05 VITALS — BP 100/52 | HR 70 | Ht 59.0 in | Wt 168.0 lb

## 2017-06-05 DIAGNOSIS — Z30011 Encounter for initial prescription of contraceptive pills: Secondary | ICD-10-CM | POA: Diagnosis not present

## 2017-06-05 DIAGNOSIS — Z3202 Encounter for pregnancy test, result negative: Secondary | ICD-10-CM | POA: Diagnosis not present

## 2017-06-05 DIAGNOSIS — A599 Trichomoniasis, unspecified: Secondary | ICD-10-CM

## 2017-06-05 LAB — POCT URINE PREGNANCY: PREG TEST UR: NEGATIVE

## 2017-06-05 LAB — POCT WET PREP (WET MOUNT)
CLUE CELLS WET PREP WHIFF POC: NEGATIVE
TRICHOMONAS WET PREP HPF POC: ABSENT

## 2017-06-05 MED ORDER — NORETHINDRONE 0.35 MG PO TABS: 1.0000 | ORAL_TABLET | Freq: Every day | ORAL | 11 refills | Status: DC

## 2017-06-05 NOTE — Patient Instructions (Signed)
Set alarm to take pill at exact same time every day, if you are late taking it and have sex, use condom  Norethindrone tablets (contraception) What is this medicine? NORETHINDRONE (nor eth IN drone) is an oral contraceptive. The product contains a female hormone known as a progestin. It is used to prevent pregnancy. This medicine may be used for other purposes; ask your health care provider or pharmacist if you have questions. COMMON BRAND NAME(S): Camila, Deblitane 28-Day, Errin, Heather, [place], Jolivette, [place], Nor-QD, Nora-BE, Norlyroc, Ortho Micronor, [employer] 28-Day What should I tell my health care provider before I take this medicine? They need to know if you have any of these conditions: -blood vessel disease or blood clots -breast, cervical, or vaginal cancer -diabetes -heart disease -kidney disease -liver disease -mental depression -migraine -seizures -stroke -vaginal bleeding -an unusual or allergic reaction to norethindrone, other medicines, foods, dyes, or preservatives -pregnant or trying to get pregnant -breast-feeding How should I use this medicine? Take this medicine by mouth with a glass of water. You may take it with or without food. Follow the directions on the prescription label. Take this medicine at the same time each day and in the order directed on the package. Do not take your medicine more often than directed. Contact your pediatrician regarding the use of this medicine in children. Special care may be needed. This medicine has been used in female children who have started having menstrual periods. A patient package insert for the product will be given with each prescription and refill. Read this sheet carefully each time. The sheet may change frequently. Overdosage: If you think you have taken too much of this medicine contact a poison control center or emergency room at once. NOTE: This medicine is only for you. Do not share this medicine with others. What  if I miss a dose? Try not to miss a dose. Every time you miss a dose or take a dose late your chance of pregnancy increases. When 1 pill is missed (even if only 3 hours late), take the missed pill as soon as possible and continue taking a pill each day at the regular time (use a back up method of birth control for the next 48 hours). If more than 1 dose is missed, use an additional birth control method for the rest of your pill pack until menses occurs. Contact your health care professional if more than 1 dose has been missed. What may interact with this medicine? Do not take this medicine with any of the following medications: -amprenavir or fosamprenavir -bosentan This medicine may also interact with the following medications: -antibiotics or medicines for infections, especially rifampin, rifabutin, rifapentine, and griseofulvin, and possibly penicillins or tetracyclines -aprepitant -barbiturate medicines, such as phenobarbital -carbamazepine -felbamate -modafinil -oxcarbazepine -phenytoin -ritonavir or other medicines for HIV infection or AIDS -St. John's wort -topiramate This list may not describe all possible interactions. Give your health care provider a list of all the medicines, herbs, non-prescription drugs, or dietary supplements you use. Also tell them if you smoke, drink alcohol, or use illegal drugs. Some items may interact with your medicine. What should I watch for while using this medicine? Visit your doctor or health care professional for regular checks on your progress. You will need a regular breast and pelvic exam and Pap smear while on this medicine. Use an additional method of birth control during the first cycle that you take these tablets. If you have any reason to think you are pregnant, stop taking  this medicine right away and contact your doctor or health care professional. If you are taking this medicine for hormone related problems, it may take several cycles of  use to see improvement in your condition. This medicine does not protect you against HIV infection (AIDS) or any other sexually transmitted diseases. What side effects may I notice from receiving this medicine? Side effects that you should report to your doctor or health care professional as soon as possible: -breast tenderness or discharge -pain in the abdomen, chest, groin or leg -severe headache -skin rash, itching, or hives -sudden shortness of breath -unusually weak or tired -vision or speech problems -yellowing of skin or eyes Side effects that usually do not require medical attention (report to your doctor or health care professional if they continue or are bothersome): -changes in sexual desire -change in menstrual flow -facial hair growth -fluid retention and swelling -headache -irritability -nausea -weight gain or loss This list may not describe all possible side effects. Call your doctor for medical advice about side effects. You may report side effects to FDA at 1-800-FDA-1088. Where should I keep my medicine? Keep out of the reach of children. Store at room temperature between 15 and 30 degrees C (59 and 86 degrees F). Throw away any unused medicine after the expiration date. NOTE: This sheet is a summary. It may not cover all possible information. If you have questions about this medicine, talk to your doctor, pharmacist, or health care provider.  2018 Elsevier/Gold Standard (2012-05-01 16:41:35)

## 2017-06-05 NOTE — Progress Notes (Signed)
   GYN Visit Patient name: Tara Rios MRN [id number]  Date of birth: 08/15/1971 CC & HPI:  Tara Rios is a 46 y.o. G3P3 African American female being seen today for trichomonas POC. She was dx and her and her partner were treated 05/15/17, both finished all of metronidazole. Hasn't had sex since.  Also desires contraception, does have controlled HTN. Does not smoke, no h/o DVT/PE, CVA, MI, or migraines w/ aura. Discussed d/t her age and HTN, I would recommend projestin-only methods- discussed all, wants POPs.    Patient's last menstrual period was 05/22/2017 (approximate). The current method of family planning is condoms  Last pap 2016. Results were:  normal Review of Systems:  Denies any headaches, blurred vision, fatigue, shortness of breath, chest pain, abdominal pain, abnormal vaginal discharge/itching/odor/irritation, problems with periods, bowel movements, urination, or intercourse unless otherwise stated above.  Pertinent History Reviewed:  Reviewed past medical,surgical, social and family history.  Reviewed problem list, medications and allergies. Objective Findings:    Vitals:   06/05/17 0905  BP: (!) 100/52  Pulse: 70  Weight: 168 lb (76.2 kg)  Height:  (1.499 m)  Body mass index is 33.93 kg/m.  Physical Examination:  General appearance - well appearing, and in no distress  Mental status - alert, oriented to person, place, and time  Skin: Warm & dry  Pelvic - VULVA: normal appearing vulva with no masses, tenderness or lesions,  VAGINA: normal appearing vagina with normal color and discharge, no lesions,  CERVIX: normal appearing cervix without discharge or lesions  Results for orders placed or performed in visit on 06/05/17 (from the past 24 hour(s))  POCT Wet Prep [facility])   Collection Time: 06/05/17  9:32 AM  Result Value Ref Range   Source Wet Prep POC vaginal    WBC, Wet Prep HPF POC none    Bacteria Wet Prep HPF POC None (A) Few   BACTERIA WET PREP  MORPHOLOGY POC     Clue Cells Wet Prep HPF POC None None   Clue Cells Wet Prep Whiff POC Negative Whiff    Yeast Wet Prep HPF POC None    KOH Wet Prep POC     Trichomonas Wet Prep HPF POC Absent Absent  POCT urine pregnancy   Collection Time: 06/05/17 11:22 AM  Result Value Ref Range   Preg Test, Ur Negative Negative    Assessment & Plan:   1) Trichomonas POC> neg  2) Contraception management> Rx micronor w/ 11RF, understands has to take at exact same time daily to be effective, if late taking use condom as back-up  Orders Placed This Encounter  Procedures  . POCT Wet Prep [employer])  . POCT urine pregnancy    Return for after 2/15 for , Pap & physical.  Marge Duncans CNM, [facility] 06/05/2017 11:22 AM

## 2017-06-17 ENCOUNTER — Other Ambulatory Visit: Payer: Self-pay | Admitting: *Deleted

## 2017-06-19 ENCOUNTER — Other Ambulatory Visit: Payer: Self-pay | Admitting: *Deleted

## 2017-06-19 ENCOUNTER — Encounter: Payer: Self-pay | Admitting: Advanced Practice Midwife

## 2017-06-25 ENCOUNTER — Encounter: Payer: Self-pay | Admitting: Advanced Practice Midwife

## 2017-06-26 ENCOUNTER — Other Ambulatory Visit: Payer: Self-pay | Admitting: Advanced Practice Midwife

## 2017-06-26 ENCOUNTER — Encounter: Payer: Self-pay | Admitting: Advanced Practice Midwife

## 2017-06-26 MED ORDER — ZOLPIDEM TARTRATE 5 MG PO TABS: 5.0000 mg | ORAL_TABLET | Freq: Every evening | ORAL | 3 refills | Status: DC | PRN

## 2017-06-26 NOTE — Progress Notes (Signed)
ambien refilled to [facility]

## 2017-10-13 ENCOUNTER — Other Ambulatory Visit (HOSPITAL_COMMUNITY)
Admission: RE | Admit: 2017-10-13 | Discharge: 2017-10-13 | Disposition: A | Discharge status: Home / Self Care | Payer: Self-pay | Source: Ambulatory Visit | Attending: Obstetrics & Gynecology | Admitting: Obstetrics & Gynecology

## 2017-10-13 ENCOUNTER — Encounter: Payer: Self-pay | Admitting: Women's Health

## 2017-10-13 ENCOUNTER — Ambulatory Visit (INDEPENDENT_AMBULATORY_CARE_PROVIDER_SITE_OTHER): Payer: No Typology Code available for payment source | Admitting: Women's Health

## 2017-10-13 ENCOUNTER — Other Ambulatory Visit: Payer: Self-pay

## 2017-10-13 VITALS — BP 138/82 | HR 86 | Ht 59.0 in | Wt 173.5 lb

## 2017-10-13 DIAGNOSIS — Z01419 Encounter for gynecological examination (general) (routine) without abnormal findings: Secondary | ICD-10-CM | POA: Insufficient documentation

## 2017-10-13 DIAGNOSIS — I10 Essential (primary) hypertension: Secondary | ICD-10-CM | POA: Diagnosis not present

## 2017-10-13 DIAGNOSIS — Z01411 Encounter for gynecological examination (general) (routine) with abnormal findings: Secondary | ICD-10-CM | POA: Diagnosis not present

## 2017-10-13 DIAGNOSIS — N6002 Solitary cyst of left breast: Secondary | ICD-10-CM | POA: Diagnosis not present

## 2017-10-13 DIAGNOSIS — R87610 Atypical squamous cells of undetermined significance on cytologic smear of cervix (ASC-US): Secondary | ICD-10-CM | POA: Insufficient documentation

## 2017-10-13 DIAGNOSIS — R87810 Cervical high risk human papillomavirus (HPV) DNA test positive: Secondary | ICD-10-CM | POA: Insufficient documentation

## 2017-10-13 NOTE — Progress Notes (Signed)
   WELL-WOMAN EXAMINATION Patient name: Tara Rios MRN [id number]  Date of birth: May 10, 1971 Chief Complaint:   Gynecologic Exam  History of Present Illness:   Tara Rios is a 47 y.o. G3P3 African American female being seen today for a routine well-woman exam. States she is due for mammogram in march, has Lt breast cyst they found on mammo last year, she is unable to feel it, gets a little tender just prior to menses.  Current complaints: none  PC[facility]: [facility]      does not desire labs-does w/ PCP Patient's last menstrual period was 09/13/2017. The current method of family planning is abstinence Last pap 01/03/15. Results were: normal Last mammogram: 10/14/16. Results were: birads 2, benign Lt breast cyst Last colonoscopy: never. Results were: n/a  Review of Systems:   Pertinent items are noted in HPI Denies any headaches, blurred vision, fatigue, shortness of breath, chest pain, abdominal pain, abnormal vaginal discharge/itching/odor/irritation, problems with periods, bowel movements, urination, or intercourse unless otherwise stated above. Pertinent History Reviewed:  Reviewed past medical,surgical, social and family history.  Reviewed problem list, medications and allergies. Physical Assessment:   Vitals:   10/13/17 1610  BP: 138/82  Pulse: 86  Weight: 173 lb 8 oz (78.7 kg)  Height: 4\' 11"  (1.499 m)  Body mass index is 35.04 kg/m.        Physical Examination:   General appearance - well appearing, and in no distress  Mental status - alert, oriented to person, place, and time  Psych:  She has a normal mood and affect  Skin - warm and dry, normal color, no suspicious lesions noted  Chest - effort normal, all lung fields clear to auscultation bilaterally  Heart - normal rate and regular rhythm  Neck:  midline trachea, no thyromegaly or nodules  Breasts - breasts appear normal, no suspicious masses, no skin or nipple changes or  axillary nodes  Abdomen -  soft, nontender, nondistended, no masses or organomegaly  Pelvic - VULVA: normal appearing vulva with no masses, tenderness or lesions  VAGINA: normal appearing vagina with normal color and discharge, no lesions  CERVIX: normal appearing cervix without discharge or lesions, no CMT  Thin prep pap is done w/ HR HPV cotesting  UTERUS: uterus is felt to be normal size, shape, consistency and nontender   ADNEXA: No adnexal masses or tenderness noted.  Extremities:  No swelling or varicosities noted  No results found for this or any previous visit (from the past 24 hour(s)).  Assessment & Plan:  1) Well-Woman Exam  2) Lt breast cyst> found on last years mammo, unable to palpate w/ exam, pt to call to schedule screening mammo for this year  3) CHTN> on Labetalol, managed by PCP  Labs/procedures today: pap  Mammogram, pt to schedule screening mammo   Colonoscopy @45 -50yo-discuss w/ PCP, or sooner if problems  No orders of the defined types were placed in this encounter.   Follow-up: Return in about 1 year (around 10/13/2018) for Physical.  Cheral MarkerKimberly R Miko Sirico CNM, [facility] 10/13/2017 5:16 PM

## 2017-10-16 LAB — CYTOLOGY - PAP
Diagnosis: UNDETERMINED — AB
HPV: DETECTED — AB

## 2017-10-21 ENCOUNTER — Encounter: Payer: Self-pay | Admitting: Women's Health

## 2017-10-21 ENCOUNTER — Telehealth: Payer: Self-pay | Admitting: *Deleted

## 2017-10-21 ENCOUNTER — Telehealth: Payer: Self-pay | Admitting: Obstetrics & Gynecology

## 2017-10-21 DIAGNOSIS — R87619 Unspecified abnormal cytological findings in specimens from cervix uteri: Secondary | ICD-10-CM | POA: Insufficient documentation

## 2017-10-21 NOTE — Telephone Encounter (Signed)
Pt called back and wanted to know if she has cervical cancer. I explained to her that her pap smear showed atypical cells with positive hpv and it doesn't say that she has cancer. Explained colposcopy procedure to patient. Patient states that she feels better and will call us back if she has more questions.

## 2017-10-21 NOTE — Telephone Encounter (Signed)
Pt sent mychart message requesting that I give her a call back. Attempted to call patient and heard message that mailbox is full.

## 2017-10-30 ENCOUNTER — Other Ambulatory Visit: Payer: Self-pay | Admitting: Women's Health

## 2017-10-30 ENCOUNTER — Encounter: Payer: Self-pay | Admitting: Women's Health

## 2017-10-30 MED ORDER — ZOLPIDEM TARTRATE 5 MG PO TABS: 5.0000 mg | ORAL_TABLET | Freq: Every evening | ORAL | 3 refills | Status: DC | PRN

## 2017-11-03 ENCOUNTER — Ambulatory Visit: Payer: No Typology Code available for payment source | Admitting: Obstetrics & Gynecology

## 2017-11-03 ENCOUNTER — Other Ambulatory Visit: Payer: Self-pay

## 2017-11-03 ENCOUNTER — Encounter: Payer: Self-pay | Admitting: Obstetrics & Gynecology

## 2017-11-03 VITALS — BP 138/82 | HR 68 | Ht 59.0 in | Wt 175.0 lb

## 2017-11-03 DIAGNOSIS — R87810 Cervical high risk human papillomavirus (HPV) DNA test positive: Secondary | ICD-10-CM

## 2017-11-03 DIAGNOSIS — R87610 Atypical squamous cells of undetermined significance on cytologic smear of cervix (ASC-US): Secondary | ICD-10-CM | POA: Diagnosis not present

## 2017-11-03 DIAGNOSIS — Z3202 Encounter for pregnancy test, result negative: Secondary | ICD-10-CM | POA: Diagnosis not present

## 2017-11-03 LAB — POCT URINE PREGNANCY: Preg Test, Ur: NEGATIVE

## 2017-11-03 NOTE — Progress Notes (Signed)
Colposcopy Procedure Note:  Colposcopy Procedure Note  Indications: Pap smear 1 months ago showed: ASCUS with POSITIVE high risk HPV. The prior pap showed no abnormalities.  Prior cervical/vaginal disease: normal exam without visible pathology. Prior cervical treatment: no treatment.  Smoker:  No. New sexual partner:  Yes.      History of abnormal Pap: no  Procedure Details  The risks and benefits of the procedure and Written informed consent obtained.  Speculum placed in vagina and excellent visualization of cervix achieved, cervix swabbed x 3 with acetic acid solution.  Findings: Cervix: no visible lesions, no mosaicism, no punctation and no abnormal vasculature; SCJ visualized 360 degrees without lesions and no biopsies taken. Vaginal inspection: normal without visible lesions. Vulvar colposcopy: vulvar colposcopy not performed.  Specimens: none  Complications: none.  Plan: Normal colposcopy, repeat Pap evaluation 1 year

## 2017-11-10 ENCOUNTER — Telehealth: Payer: Self-pay | Admitting: Obstetrics & Gynecology

## 2018-07-27 ENCOUNTER — Other Ambulatory Visit: Payer: Self-pay | Admitting: Women's Health

## 2018-07-27 MED ORDER — ZOLPIDEM TARTRATE 5 MG PO TABS: 5.0000 mg | ORAL_TABLET | Freq: Every evening | ORAL | 3 refills | Status: DC | PRN

## 2018-07-27 MED ORDER — ZOLPIDEM TARTRATE 5 MG PO TABS: 5.0000 mg | ORAL_TABLET | Freq: Every evening | ORAL | 3 refills | Status: AC | PRN

## 2021-04-11 ENCOUNTER — Other Ambulatory Visit (HOSPITAL_COMMUNITY): Payer: Self-pay

## 2021-04-11 MED ORDER — ACETAMINOPHEN-CODEINE 300-30 MG PO TABS
1.0000 | ORAL_TABLET | Freq: Three times a day (TID) | ORAL | 3 refills | Status: DC | PRN
  Filled 2021-04-11: qty 21, 7d supply, fill #0

## 2021-04-12 ENCOUNTER — Other Ambulatory Visit (HOSPITAL_COMMUNITY): Payer: Self-pay

## 2021-04-12 MED ORDER — ACETAMINOPHEN-CODEINE 300-30 MG PO TABS
1.0000 | ORAL_TABLET | Freq: Three times a day (TID) | ORAL | 0 refills | Status: DC | PRN
  Filled 2021-04-12: qty 30, 10d supply, fill #0

## 2021-04-19 ENCOUNTER — Other Ambulatory Visit (HOSPITAL_COMMUNITY): Payer: Self-pay

## 2021-06-29 ENCOUNTER — Other Ambulatory Visit: Payer: Self-pay

## 2022-02-08 ENCOUNTER — Other Ambulatory Visit: Payer: Self-pay | Admitting: Family Medicine

## 2022-02-12 ENCOUNTER — Other Ambulatory Visit: Payer: Self-pay | Admitting: Obstetrics and Gynecology

## 2023-02-20 ENCOUNTER — Ambulatory Visit (INDEPENDENT_AMBULATORY_CARE_PROVIDER_SITE_OTHER): Payer: BC Managed Care – PPO | Admitting: Orthopedic Surgery

## 2023-02-20 ENCOUNTER — Encounter: Payer: Self-pay | Admitting: Orthopedic Surgery

## 2023-02-20 VITALS — Ht 59.0 in | Wt 178.0 lb

## 2023-02-20 DIAGNOSIS — S63629A Sprain of interphalangeal joint of unspecified thumb, initial encounter: Secondary | ICD-10-CM

## 2023-02-20 MED ORDER — IBUPROFEN 800 MG PO TABS: 800.0000 mg | ORAL_TABLET | Freq: Three times a day (TID) | ORAL | 1 refills | Status: AC | PRN

## 2023-02-20 MED ORDER — HYDROCODONE-ACETAMINOPHEN 5-325 MG PO TABS: 1.0000 | ORAL_TABLET | Freq: Four times a day (QID) | ORAL | 0 refills | Status: DC | PRN

## 2023-02-20 NOTE — Progress Notes (Signed)
Chief Complaint  Patient presents with   Hand Pain    Right 1 st metacarpal pain    52 yo F fell 2 weeks ago landing on her right thumb  She was seen at Urgent Care, xrays were neg. She was placed in a splint and started on ibuprofen.  She complains of ulnar-sided thumb pain at the medical carpal phalangeal joint and deformity of the joint.  Review of Systems  Skin: Negative.   Neurological:  Negative for tingling.   Past Medical History:  Diagnosis Date   Hypertension    Allergies  Allergen Reactions   Sulfa Antibiotics    Sulfonamide Derivatives     Current Outpatient Medications:    Acetaminophen-Codeine 300-30 MG tablet, Take 1 tablet by mouth every 8 (eight) hours as needed., Disp: 30 tablet, Rfl: 0   Azilsartan-Chlorthalidone (EDARBYCLOR) 40-12.5 MG TABS, Take by mouth., Disp: , Rfl:    HYDROcodone-acetaminophen (NORCO/VICODIN) 5-325 MG tablet, Take 1 tablet by mouth every 6 (six) hours as needed for moderate pain., Disp: 20 tablet, Rfl: 0   ibuprofen (ADVIL) 800 MG tablet, Take 1 tablet (800 mg total) by mouth every 8 (eight) hours as needed., Disp: 90 tablet, Rfl: 1   labetalol (NORMODYNE) 200 MG tablet, Take 400 mg by mouth 2 (two) times daily. , Disp: , Rfl:    Acetaminophen-Codeine 300-30 MG tablet, Take 1 tablet by mouth every 8 (eight) hours as needed. (Patient not taking: Reported on 02/20/2023), Disp: 30 tablet, Rfl: 3   norethindrone (MICRONOR,CAMILA,ERRIN) 0.35 MG tablet, Take 1 tablet (0.35 mg total) by mouth daily. (Patient not taking: Reported on 10/13/2017), Disp: 1 Package, Rfl: 11   zolpidem (AMBIEN) 5 MG tablet, Take 1 tablet (5 mg total) by mouth at bedtime as needed. for sleep (Patient not taking: Reported on 02/20/2023), Disp: 30 tablet, Rfl: 3  Ht 4\' 11"  (1.499 m)   Wt 178 lb (80.7 kg)   BMI 35.95 kg/m   Physical Exam Vitals and nursing note reviewed.  Constitutional:      Appearance: Normal appearance.  HENT:     Head: Normocephalic and atraumatic.   Eyes:     General: No scleral icterus.       Right eye: No discharge.        Left eye: No discharge.     Extraocular Movements: Extraocular movements intact.     Conjunctiva/sclera: Conjunctivae normal.     Pupils: Pupils are equal, round, and reactive to light.  Cardiovascular:     Rate and Rhythm: Normal rate.     Pulses: Normal pulses.  Skin:    General: Skin is warm and dry.     Capillary Refill: Capillary refill takes less than 2 seconds.  Neurological:     General: No focal deficit present.     Mental Status: She is alert and oriented to person, place, and time.  Psychiatric:        Mood and Affect: Mood normal.        Behavior: Behavior normal.        Thought Content: Thought content normal.        Judgment: Judgment normal.    Her right thumb is swollen and tender over the ulnar collateral ligament of the thumb.  I tried to stress test but she was in too much pain the thumb is in radial deviation  The x-rays from urgent care reviewed there is no fracture noted.  I reviewed the images personally  Encounter Diagnosis  Name Primary?  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 joint of thumb - right Yes    Recommend MRI to rule out complete tear of the ulnar collateral ligament of the right thumb  Continue splinting  Prescriptions written for ibuprofen and hydrocodone  Only take hydrocodone at night  Return after MRI to determine if surgical intervention is needed.  Meds ordered this encounter  Medications   ibuprofen (ADVIL) 800 MG tablet    Sig: Take 1 tablet (800 mg total) by mouth every 8 (eight) hours as needed.    Dispense:  90 tablet    Refill:  1   HYDROcodone-acetaminophen (NORCO/VICODIN) 5-325 MG tablet    Sig: Take 1 tablet by mouth every 6 (six) hours as needed for moderate pain.    Dispense:  20 tablet    Refill:  0

## 2023-02-20 NOTE — Patient Instructions (Signed)
Call central scheduling [phone] then call office to make follow up

## 2023-02-21 ENCOUNTER — Telehealth: Payer: Self-pay | Admitting: Orthopedic Surgery

## 2023-02-21 NOTE — Telephone Encounter (Signed)
Changed order from MRI with contrast of hand to MRI without contrast of hand was ordered incorrectly

## 2023-03-11 ENCOUNTER — Telehealth: Payer: Self-pay | Admitting: Orthopedic Surgery

## 2023-03-11 ENCOUNTER — Other Ambulatory Visit: Payer: BC Managed Care – PPO

## 2023-03-11 NOTE — Telephone Encounter (Signed)
  02/20/2023  3:59 PM Filip Luten, Cindra Eves, RT Called [employer] [phone] -  Note: Called [employer] [phone] Then had to call [employer] [phone] called for [place]  APPROVED [id number]  TODAY THROUGH 12/24 Can you check on this and see why it was cancelled, was approved on 02/20/23

## 2023-03-11 NOTE — Telephone Encounter (Signed)
Dr. Mort Sawyers pt - pt lvm stating that she has been waiting weeks to have her MRI which was for today, but radiology cancelled it stating it had never been authorized.  She has been rescheduled for 7/29 and will need a follow up appt, but she wants a call back regarding the authorization.  [phone]

## 2023-03-13 ENCOUNTER — Telehealth: Payer: Self-pay | Admitting: Orthopedic Surgery

## 2023-03-13 NOTE — Telephone Encounter (Signed)
  02/20/2023  3:59 PM Novali Vollman, Cindra Eves, RT Called [employer] [phone] -  Note: Called [employer] [phone] Then had to call [facility] [phone] called for [place]  APPROVED [phone]  TODAY THROUGH 12/24 Can you check on this and see why it was cancelled, was approved on 02/20/23  Did you find out anything from [facility]

## 2023-03-13 NOTE — Telephone Encounter (Signed)
Dr. Mort Sawyers pt - the patient has left another message checking the status of her MRI.

## 2023-03-14 NOTE — Telephone Encounter (Signed)
Tara Rios, please call this patient.  She just presented to the office, upset and irritated.  She stated that her thumb is throbbing and she been trying to get answers about her MRI and she can't get anything.  She asked me where Dr. Romeo Apple was, I told her he was not here today. [phone]

## 2023-03-17 ENCOUNTER — Encounter: Payer: Self-pay | Admitting: Orthopedic Surgery

## 2023-03-17 ENCOUNTER — Other Ambulatory Visit: Payer: Self-pay | Admitting: Orthopedic Surgery

## 2023-03-17 DIAGNOSIS — S63629A Sprain of interphalangeal joint of unspecified thumb, initial encounter: Secondary | ICD-10-CM

## 2023-03-17 MED ORDER — HYDROCODONE-ACETAMINOPHEN 5-325 MG PO TABS: 1.0000 | ORAL_TABLET | ORAL | 0 refills | Status: DC | PRN

## 2023-03-17 NOTE — Telephone Encounter (Signed)
I called patient, discussed, she has not been called/scheduled yet.  I gave her Estill Batten number to call and sched appt.  I will call them and make sure to get sooner appt if that one is not soon enough.  Auth is/was in chart, and they cancelled due to no auth.  Will touch base with patient later today.

## 2023-03-17 NOTE — Progress Notes (Signed)
Patient still has not had MRI, [facility] cancelled her appt due to no auth.  However Berkley Harvey was done.  In process of getting rescheduled.    Thumb is throbbing, she has 3 hydrocodone left, is asking for refill.  She is taking ibuprofen mostly, hydrocodone prn.    [facility].

## 2023-03-17 NOTE — Telephone Encounter (Signed)
Pt called, lvm stating that [facility] stated that the reason they are having trouble is the autho was done for [employer] and not [facility].  [phone]

## 2023-03-17 NOTE — Telephone Encounter (Signed)
I called [employer] and s/w Bjorn Loser, facility is listed as [place], not [facility].  I changed the facility to [facility] [address] [place].  I called patient back and advised, will have her call [facility] to schedule.

## 2023-03-20 ENCOUNTER — Ambulatory Visit
Admission: RE | Admit: 2023-03-20 | Discharge: 2023-03-20 | Disposition: A | Discharge status: Home / Self Care | Payer: BC Managed Care – PPO | Source: Ambulatory Visit | Attending: Orthopedic Surgery | Admitting: Orthopedic Surgery

## 2023-03-20 DIAGNOSIS — S63629A Sprain of interphalangeal joint of unspecified thumb, initial encounter: Secondary | ICD-10-CM

## 2023-03-24 ENCOUNTER — Ambulatory Visit: Payer: BC Managed Care – PPO | Admitting: Orthopedic Surgery

## 2023-03-24 VITALS — BP 156/90 | HR 77

## 2023-03-24 DIAGNOSIS — S63629A Sprain of interphalangeal joint of unspecified thumb, initial encounter: Secondary | ICD-10-CM

## 2023-03-24 MED ORDER — HYDROCODONE-ACETAMINOPHEN 5-325 MG PO TABS: 1.0000 | ORAL_TABLET | ORAL | 0 refills | Status: DC | PRN

## 2023-03-24 NOTE — Progress Notes (Signed)
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  The MRI had to be canceled because of something to do with where it was scheduled and how it was done in terms of her precertification aspect  She has been having pain on the ulnar side of her thumb with swelling and deformity.  Her MRI was not officially read but my interpretation of the MRI is that it is an ulnar collateral ligament tear  The patient is a candidate for repair and will be sent for hand consultation  Encounter Diagnoses  Name Primary?   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 joint of thumb Yes   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 joint of thumb - right      Meds ordered this encounter  Medications   HYDROcodone-acetaminophen (NORCO/VICODIN) 5-325 MG tablet    Sig: Take 1 tablet by mouth every 4 (four) hours as needed for moderate pain.    Dispense:  10 tablet    Refill:  0

## 2023-04-14 ENCOUNTER — Other Ambulatory Visit: Payer: Self-pay | Admitting: Orthopedic Surgery

## 2023-04-14 ENCOUNTER — Ambulatory Visit: Payer: BC Managed Care – PPO | Admitting: Orthopedic Surgery

## 2023-04-14 DIAGNOSIS — S63629A Sprain of interphalangeal joint of unspecified thumb, initial encounter: Secondary | ICD-10-CM

## 2023-04-14 DIAGNOSIS — S63621A Sprain of interphalangeal joint of right thumb, initial encounter: Secondary | ICD-10-CM | POA: Diagnosis not present

## 2023-04-14 NOTE — H&P (View-Only) (Signed)
 Tara Rios - 52 y.o. female MRN [id number]  Date of birth: 10/10/1970  Office Visit Note: Visit Date: 04/14/2023 PCP: Mirna Mires, MD Referred by: Vickki Hearing, MD  Subjective: No chief complaint on file.  HPI: Tara Rios is a pleasant 52 y.o. female who presents today for evaluation for a right thumb ulnar collateral ligament tear no sustained in June.  She was seen recently by Dr. Romeo Apple, who referred to me for evaluation.  She is undergone MRI workup which confirmed this diagnosis.  Injury described as a mechanical fall onto the outstretched right hand, thumb was pulled and a radial deviated position at the time with associated pain and swelling.  Currently, she is localizing her pain to the ulnar aspect of the right thumb MCP consistent with the diagnostic workup.  Denies any numbness or tingling.  She is right-hand dominant.  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   Assessment & Plan: Visit Diagnoses: No diagnosis found.  Plan: Extensive discussion was had the patient today regarding her right thumb ulnar collateral ligament tear.  I viewed the results of her MRI study which are consistent with her clinical examination.  She has ongoing pain and instability of the right thumb ulnar collateral ligament requiring repair.  In order to restore anatomy, joint stabilization and promote healing, patient is indicated for right thumb ulnar collateral ligament repair with internal brace augmentation.  Risks and benefits of the procedure were discussed, risks including but not limited to infection, bleeding, scarring, stiffness, nerve injury,  vascular injury, hardware complication, recurrence of symptoms and need for subsequent operation.  Patient expressed understanding.  We also discussed postoperative recovery in the form of immobilization for 4 weeks, range of motion for 4 weeks followed by strengthening for 4 weeks.  The goal will be for return to activities at approximately 3 months time postoperatively.  Will move forward surgical scheduling at the next available date.  Surgery can be performed with IV sedation and block for the right upper extremity versus general anesthesia.    Follow-up: No follow-ups on file.   Meds & Orders: No orders of the defined types were placed in this encounter.  No orders of the defined types were placed in this encounter.    Procedures: No procedures performed      Clinical History: No specialty comments available.  She reports that she has never smoked. She has never used smokeless tobacco. No results for input(s): "HGBA1C", "LABURIC" in the last 8760 hours.  Objective:   Vital Signs: There were no vitals taken for this visit.  Physical Exam  Gen: Well-appearing, in no acute distress; non-toxic CV: Regular Rate. Well-perfused. Warm.  Resp: Breathing unlabored on room air; no wheezing. Psych: Fluid speech in conversation; appropriate affect; normal thought process Neuro: Sensation intact throughout. No gross coordination deficits.   Ortho Exam Right hand: - Significant pain and swelling localized to the right thumb MCP ulnar aspect - Significant instability with stress testing at both neutral and 30 degrees of flexion at the thumb MCP with radial deviation, significant pain associated as well - Collateral ligament stress testing of the left thumb MCP indicates stable position of the  thumb at both neutral and 30 degrees of flexion with radial deviation  Imaging: No results found.  Past Medical/Family/Surgical/Social History: Medications & Allergies reviewed per EMR, new  medications updated. Patient Active Problem List   Diagnosis Date Noted   Abnormal Pap smear of cervix 10/21/2017   Trichomonas infection 05/15/2017   Hypertension    BURSITIS, OLECRANON 03/13/2009   Past Medical History:  Diagnosis Date   Hypertension    Family History  Problem Relation Age of Onset   Hypertension Mother    Asthma Daughter    ADD / ADHD Son    Bipolar disorder Son    Past Surgical History:  Procedure Laterality Date   TONSILLECTOMY     Social History   Occupational History   Not on file  Tobacco Use   Smoking status: Never   Smokeless tobacco: Never  Substance and Sexual Activity   Alcohol use: Yes    Comment: occassional   Drug use: No   Sexual activity: Not Currently    Partners: Male    Birth control/protection: Condom    Trygve Thal Fara Boros) Denese Killings, M.D. [facility] 3:57 PM

## 2023-04-14 NOTE — Progress Notes (Signed)
DACI ARNT - 51 y.o. female MRN [id number]  Date of birth: 10/10/1970  Office Visit Note: Visit Date: 04/14/2023 PCP: Mirna Mires, MD Referred by: Vickki Hearing, MD  Subjective: No chief complaint on file.  HPI: Tara Rios is a pleasant 52 y.o. female who presents today for evaluation for a right thumb ulnar collateral ligament tear no sustained in June.  She was seen recently by Dr. Romeo Apple, who referred to me for evaluation.  She is undergone MRI workup which confirmed this diagnosis.  Injury described as a mechanical fall onto the outstretched right hand, thumb was pulled and a radial deviated position at the time with associated pain and swelling.  Currently, she is localizing her pain to the ulnar aspect of the right thumb MCP consistent with the diagnostic workup.  Denies any numbness or tingling.  She is right-hand dominant.  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   Assessment & Plan: Visit Diagnoses: No diagnosis found.  Plan: Extensive discussion was had the patient today regarding her right thumb ulnar collateral ligament tear.  I viewed the results of her MRI study which are consistent with her clinical examination.  She has ongoing pain and instability of the right thumb ulnar collateral ligament requiring repair.  In order to restore anatomy, joint stabilization and promote healing, patient is indicated for right thumb ulnar collateral ligament repair with internal brace augmentation.  Risks and benefits of the procedure were discussed, risks including but not limited to infection, bleeding, scarring, stiffness, nerve injury,  vascular injury, hardware complication, recurrence of symptoms and need for subsequent operation.  Patient expressed understanding.  We also discussed postoperative recovery in the form of immobilization for 4 weeks, range of motion for 4 weeks followed by strengthening for 4 weeks.  The goal will be for return to activities at approximately 3 months time postoperatively.  Will move forward surgical scheduling at the next available date.  Surgery can be performed with IV sedation and block for the right upper extremity versus general anesthesia.    Follow-up: No follow-ups on file.   Meds & Orders: No orders of the defined types were placed in this encounter.  No orders of the defined types were placed in this encounter.    Procedures: No procedures performed      Clinical History: No specialty comments available.  She reports that she has never smoked. She has never used smokeless tobacco. No results for input(s): "HGBA1C", "LABURIC" in the last 8760 hours.  Objective:   Vital Signs: There were no vitals taken for this visit.  Physical Exam  Gen: Well-appearing, in no acute distress; non-toxic CV: Regular Rate. Well-perfused. Warm.  Resp: Breathing unlabored on room air; no wheezing. Psych: Fluid speech in conversation; appropriate affect; normal thought process Neuro: Sensation intact throughout. No gross coordination deficits.   Ortho Exam Right hand: - Significant pain and swelling localized to the right thumb MCP ulnar aspect - Significant instability with stress testing at both neutral and 30 degrees of flexion at the thumb MCP with radial deviation, significant pain associated as well - Collateral ligament stress testing of the left thumb MCP indicates stable position of the  thumb at both neutral and 30 degrees of flexion with radial deviation  Imaging: No results found.  Past Medical/Family/Surgical/Social History: Medications & Allergies reviewed per EMR, new  medications updated. Patient Active Problem List   Diagnosis Date Noted   Abnormal Pap smear of cervix 10/21/2017   Trichomonas infection 05/15/2017   Hypertension    BURSITIS, OLECRANON 03/13/2009   Past Medical History:  Diagnosis Date   Hypertension    Family History  Problem Relation Age of Onset   Hypertension Mother    Asthma Daughter    ADD / ADHD Son    Bipolar disorder Son    Past Surgical History:  Procedure Laterality Date   TONSILLECTOMY     Social History   Occupational History   Not on file  Tobacco Use   Smoking status: Never   Smokeless tobacco: Never  Substance and Sexual Activity   Alcohol use: Yes    Comment: occassional   Drug use: No   Sexual activity: Not Currently    Partners: Male    Birth control/protection: Condom    Trygve Thal Fara Boros) Denese Killings, M.D. [facility] 3:57 PM

## 2023-04-15 ENCOUNTER — Encounter (HOSPITAL_BASED_OUTPATIENT_CLINIC_OR_DEPARTMENT_OTHER): Payer: Self-pay | Admitting: Orthopedic Surgery

## 2023-04-15 ENCOUNTER — Other Ambulatory Visit: Payer: Self-pay

## 2023-04-15 MED ORDER — HYDROCODONE-ACETAMINOPHEN 5-325 MG PO TABS: 1.0000 | ORAL_TABLET | ORAL | 0 refills | Status: DC | PRN

## 2023-04-18 ENCOUNTER — Encounter (HOSPITAL_BASED_OUTPATIENT_CLINIC_OR_DEPARTMENT_OTHER)
Admission: RE | Admit: 2023-04-18 | Discharge: 2023-04-18 | Disposition: A | Discharge status: Home / Self Care | Payer: BC Managed Care – PPO | Source: Ambulatory Visit | Attending: Orthopedic Surgery | Admitting: Orthopedic Surgery

## 2023-04-18 ENCOUNTER — Other Ambulatory Visit: Payer: Self-pay

## 2023-04-18 DIAGNOSIS — I10 Essential (primary) hypertension: Secondary | ICD-10-CM | POA: Insufficient documentation

## 2023-04-18 DIAGNOSIS — Z01818 Encounter for other preprocedural examination: Secondary | ICD-10-CM | POA: Insufficient documentation

## 2023-04-18 LAB — BASIC METABOLIC PANEL
Anion gap: 10 (ref 5–15)
BUN: 29 mg/dL — ABNORMAL HIGH (ref 6–20)
CO2: 26 mmol/L (ref 22–32)
Calcium: 9.2 mg/dL (ref 8.9–10.3)
Chloride: 99 mmol/L (ref 98–111)
Creatinine, Ser: 1.33 mg/dL — ABNORMAL HIGH (ref 0.44–1.00)
GFR, Estimated: 48 mL/min — ABNORMAL LOW (ref >60)
Glucose, Bld: 110 mg/dL — ABNORMAL HIGH (ref 70–99)
Potassium: 4.2 mmol/L (ref 3.5–5.1)
Sodium: 135 mmol/L (ref 135–145)

## 2023-04-21 ENCOUNTER — Other Ambulatory Visit: Payer: Self-pay | Admitting: Orthopedic Surgery

## 2023-04-21 DIAGNOSIS — S63629A Sprain of interphalangeal joint of unspecified thumb, initial encounter: Secondary | ICD-10-CM

## 2023-04-22 ENCOUNTER — Encounter (HOSPITAL_BASED_OUTPATIENT_CLINIC_OR_DEPARTMENT_OTHER)
Admission: RE | Disposition: A | Discharge status: Home / Self Care | Payer: Self-pay | Source: Ambulatory Visit | Attending: Orthopedic Surgery

## 2023-04-22 ENCOUNTER — Other Ambulatory Visit: Payer: Self-pay

## 2023-04-22 ENCOUNTER — Encounter (HOSPITAL_BASED_OUTPATIENT_CLINIC_OR_DEPARTMENT_OTHER): Payer: Self-pay | Admitting: Orthopedic Surgery

## 2023-04-22 ENCOUNTER — Ambulatory Visit (HOSPITAL_BASED_OUTPATIENT_CLINIC_OR_DEPARTMENT_OTHER)
Admission: RE | Admit: 2023-04-22 | Discharge: 2023-04-22 | Disposition: A | Discharge status: Home / Self Care | Payer: BC Managed Care – PPO | Source: Ambulatory Visit | Attending: Orthopedic Surgery | Admitting: Orthopedic Surgery

## 2023-04-22 ENCOUNTER — Ambulatory Visit (HOSPITAL_BASED_OUTPATIENT_CLINIC_OR_DEPARTMENT_OTHER): Payer: BC Managed Care – PPO | Admitting: Anesthesiology

## 2023-04-22 ENCOUNTER — Ambulatory Visit (HOSPITAL_BASED_OUTPATIENT_CLINIC_OR_DEPARTMENT_OTHER): Payer: BC Managed Care – PPO

## 2023-04-22 DIAGNOSIS — S63621A Sprain of interphalangeal joint of right thumb, initial encounter: Secondary | ICD-10-CM | POA: Diagnosis not present

## 2023-04-22 DIAGNOSIS — W19XXXA Unspecified fall, initial encounter: Secondary | ICD-10-CM | POA: Insufficient documentation

## 2023-04-22 DIAGNOSIS — I10 Essential (primary) hypertension: Secondary | ICD-10-CM | POA: Diagnosis not present

## 2023-04-22 DIAGNOSIS — Z8249 Family history of ischemic heart disease and other diseases of the circulatory system: Secondary | ICD-10-CM | POA: Diagnosis not present

## 2023-04-22 DIAGNOSIS — S53441A Ulnar collateral ligament sprain of right elbow, initial encounter: Secondary | ICD-10-CM | POA: Diagnosis not present

## 2023-04-22 DIAGNOSIS — S63629A Sprain of interphalangeal joint of unspecified thumb, initial encounter: Secondary | ICD-10-CM

## 2023-04-22 DIAGNOSIS — Z01818 Encounter for other preprocedural examination: Secondary | ICD-10-CM

## 2023-04-22 HISTORY — PX: ULNAR COLLATERAL LIGAMENT REPAIR: SHX6159

## 2023-04-22 LAB — POCT PREGNANCY, URINE: Preg Test, Ur: NEGATIVE

## 2023-04-22 SURGERY — REPAIR, LIGAMENT, ULNAR COLLATERAL
Anesthesia: Monitor Anesthesia Care | Site: Thumb | Laterality: Right

## 2023-04-22 MED ORDER — CEFAZOLIN SODIUM-DEXTROSE 2-4 GM/100ML-% IV SOLN
2.0000 g | INTRAVENOUS | Status: AC
  Administered 2023-04-22: 2 g via INTRAVENOUS

## 2023-04-22 MED ORDER — ATROPINE SULFATE 0.4 MG/ML IV SOLN
INTRAVENOUS | Status: AC
  Filled 2023-04-22: qty 1

## 2023-04-22 MED ORDER — 0.9 % SODIUM CHLORIDE (POUR BTL) OPTIME
TOPICAL | Status: DC | PRN
  Administered 2023-04-22: 500 mL

## 2023-04-22 MED ORDER — HYDROMORPHONE HCL 1 MG/ML IJ SOLN: 0.2500 mg | INTRAMUSCULAR | Status: DC | PRN

## 2023-04-22 MED ORDER — OXYCODONE HCL 5 MG PO TABS: 5.0000 mg | ORAL_TABLET | Freq: Four times a day (QID) | ORAL | 0 refills | Status: DC | PRN

## 2023-04-22 MED ORDER — ROPIVACAINE HCL 5 MG/ML IJ SOLN
INTRAMUSCULAR | Status: DC | PRN
  Administered 2023-04-22: 30 mL via PERINEURAL

## 2023-04-22 MED ORDER — PHENYLEPHRINE HCL (PRESSORS) 10 MG/ML IV SOLN
INTRAVENOUS | Status: DC | PRN
Start: 2023-04-22 — End: 2023-04-22
  Administered 2023-04-22: 80 ug via INTRAVENOUS

## 2023-04-22 MED ORDER — MIDAZOLAM HCL 2 MG/2ML IJ SOLN
INTRAMUSCULAR | Status: AC
  Filled 2023-04-22: qty 2

## 2023-04-22 MED ORDER — EPHEDRINE 5 MG/ML INJ
INTRAVENOUS | Status: AC
  Filled 2023-04-22: qty 10

## 2023-04-22 MED ORDER — PROPOFOL 500 MG/50ML IV EMUL
INTRAVENOUS | Status: DC | PRN
  Administered 2023-04-22: 100 ug/kg/min via INTRAVENOUS

## 2023-04-22 MED ORDER — EPHEDRINE SULFATE (PRESSORS) 50 MG/ML IJ SOLN
INTRAMUSCULAR | Status: DC | PRN
  Administered 2023-04-22 (×3): 5 mg via INTRAVENOUS

## 2023-04-22 MED ORDER — PHENYLEPHRINE HCL (PRESSORS) 10 MG/ML IV SOLN
INTRAVENOUS | Status: AC
  Filled 2023-04-22: qty 1

## 2023-04-22 MED ORDER — MIDAZOLAM HCL 2 MG/2ML IJ SOLN
2.0000 mg | Freq: Once | INTRAMUSCULAR | Status: AC
  Administered 2023-04-22: 2 mg via INTRAVENOUS

## 2023-04-22 MED ORDER — LIDOCAINE HCL (CARDIAC) PF 100 MG/5ML IV SOSY
PREFILLED_SYRINGE | INTRAVENOUS | Status: DC | PRN
  Administered 2023-04-22: 20 mg via INTRAVENOUS

## 2023-04-22 MED ORDER — FENTANYL CITRATE (PF) 100 MCG/2ML IJ SOLN
INTRAMUSCULAR | Status: AC
  Filled 2023-04-22: qty 2

## 2023-04-22 MED ORDER — LACTATED RINGERS IV SOLN: INTRAVENOUS | Status: DC

## 2023-04-22 MED ORDER — LIDOCAINE 2% (20 MG/ML) 5 ML SYRINGE
INTRAMUSCULAR | Status: AC
  Filled 2023-04-22: qty 5

## 2023-04-22 MED ORDER — PROMETHAZINE HCL 25 MG/ML IJ SOLN: 6.2500 mg | INTRAMUSCULAR | Status: DC | PRN

## 2023-04-22 MED ORDER — SUCCINYLCHOLINE CHLORIDE 200 MG/10ML IV SOSY
PREFILLED_SYRINGE | INTRAVENOUS | Status: AC
  Filled 2023-04-22: qty 10

## 2023-04-22 MED ORDER — ONDANSETRON HCL 4 MG/2ML IJ SOLN
INTRAMUSCULAR | Status: DC | PRN
  Administered 2023-04-22: 4 mg via INTRAVENOUS

## 2023-04-22 MED ORDER — FENTANYL CITRATE (PF) 100 MCG/2ML IJ SOLN
100.0000 ug | Freq: Once | INTRAMUSCULAR | Status: AC
  Administered 2023-04-22: 100 ug via INTRAVENOUS

## 2023-04-22 MED ORDER — ONDANSETRON HCL 4 MG/2ML IJ SOLN
INTRAMUSCULAR | Status: AC
  Filled 2023-04-22: qty 2

## 2023-04-22 MED ORDER — CEFAZOLIN SODIUM-DEXTROSE 2-4 GM/100ML-% IV SOLN
INTRAVENOUS | Status: AC
  Filled 2023-04-22: qty 100

## 2023-04-22 MED ORDER — OXYCODONE HCL 5 MG PO TABS: 5.0000 mg | ORAL_TABLET | Freq: Once | ORAL | Status: DC | PRN

## 2023-04-22 MED ORDER — PHENYLEPHRINE 80 MCG/ML (10ML) SYRINGE FOR IV PUSH (FOR BLOOD PRESSURE SUPPORT)
PREFILLED_SYRINGE | INTRAVENOUS | Status: AC
  Filled 2023-04-22: qty 20

## 2023-04-22 MED ORDER — OXYCODONE HCL 5 MG/5ML PO SOLN: 5.0000 mg | Freq: Once | ORAL | Status: DC | PRN

## 2023-04-22 SURGICAL SUPPLY — 72 items
ANCHOR REPAIR HAND WRIST (Anchor) IMPLANT
APL PRP 5X4 STRL LF DISP 70% (MISCELLANEOUS)
APL PRP STRL LF DISP 70% ISPRP (MISCELLANEOUS) ×1
APPLICATOR CHLORAPREP 3ML ORNG (MISCELLANEOUS) ×1 IMPLANT
BLADE ARTHRO LOK 4 BEAVER (BLADE) ×1 IMPLANT
BLADE AVERAGE 25X9 (BLADE) IMPLANT
BLADE SURG 15 STRL LF DISP TIS (BLADE) ×2 IMPLANT
BLADE SURG 15 STRL SS (BLADE) ×2
BNDG CMPR 5X3 KNIT ELC UNQ LF (GAUZE/BANDAGES/DRESSINGS)
BNDG CMPR 5X4 CHSV STRCH STRL (GAUZE/BANDAGES/DRESSINGS) ×1
BNDG CMPR 5X4 KNIT ELC UNQ LF (GAUZE/BANDAGES/DRESSINGS) ×1
BNDG CMPR 75X21 PLY HI ABS (MISCELLANEOUS) ×1
BNDG CMPR 9X4 STRL LF SNTH (GAUZE/BANDAGES/DRESSINGS) ×1
BNDG COHESIVE 4X5 TAN STRL LF (GAUZE/BANDAGES/DRESSINGS) ×1 IMPLANT
BNDG ELASTIC 3INX 5YD STR LF (GAUZE/BANDAGES/DRESSINGS) IMPLANT
BNDG ELASTIC 4INX 5YD STR LF (GAUZE/BANDAGES/DRESSINGS) ×1 IMPLANT
BNDG ESMARK 4X9 LF (GAUZE/BANDAGES/DRESSINGS) ×1 IMPLANT
BNDG GAUZE DERMACEA FLUFF 4 (GAUZE/BANDAGES/DRESSINGS) ×1 IMPLANT
BNDG GZE DERMACEA 4 6PLY (GAUZE/BANDAGES/DRESSINGS) ×1
CHLORAPREP W/TINT 26 (MISCELLANEOUS) IMPLANT
CORD BIPOLAR FORCEPS 12FT (ELECTRODE) ×1 IMPLANT
COVER BACK TABLE 60X90IN (DRAPES) ×1 IMPLANT
CUFF TOURN SGL QUICK 18X4 (TOURNIQUET CUFF) IMPLANT
DRAPE EXTREMITY T 121X128X90 (DISPOSABLE) ×1 IMPLANT
DRAPE IMP U-DRAPE 54X76 (DRAPES) ×1 IMPLANT
DRAPE OEC MINIVIEW 54X84 (DRAPES) ×1 IMPLANT
DRAPE SURG 17X23 STRL (DRAPES) IMPLANT
DRAPE U-SHAPE 47X51 STRL (DRAPES) IMPLANT
GAUZE PAD ABD 8X10 STRL (GAUZE/BANDAGES/DRESSINGS) ×1 IMPLANT
GAUZE SPONGE 4X4 12PLY STRL (GAUZE/BANDAGES/DRESSINGS) ×1 IMPLANT
GAUZE STRETCH 2X75IN STRL (MISCELLANEOUS) ×1 IMPLANT
GAUZE XEROFORM 1X8 LF (GAUZE/BANDAGES/DRESSINGS) ×1 IMPLANT
GLOVE BIO SURGEON STRL SZ7.5 (GLOVE) ×1 IMPLANT
GLOVE BIOGEL PI IND STRL 7.0 (GLOVE) IMPLANT
GLOVE BIOGEL PI IND STRL 7.5 (GLOVE) ×1 IMPLANT
GLOVE SURG SYN 7.5 E (GLOVE) ×1
GLOVE SURG SYN 7.5 PF PI (GLOVE) IMPLANT
GOWN STRL REUS W/ TWL LRG LVL3 (GOWN DISPOSABLE) ×2 IMPLANT
GOWN STRL REUS W/TWL LRG LVL3 (GOWN DISPOSABLE)
GOWN STRL REUS W/TWL XL LVL3 (GOWN DISPOSABLE) IMPLANT
MANIFOLD NEPTUNE II (INSTRUMENTS) ×1 IMPLANT
NDL HYPO 25X1 1.5 SAFETY (NEEDLE) IMPLANT
NDL SUT 6 .5 CRC .975X.05 MAYO (NEEDLE) IMPLANT
NEEDLE HYPO 25X1 1.5 SAFETY (NEEDLE)
NEEDLE MAYO TAPER (NEEDLE)
NS IRRIG 1000ML POUR BTL (IV SOLUTION) IMPLANT
PACK BASIN DAY SURGERY FS (CUSTOM PROCEDURE TRAY) ×1 IMPLANT
PAD CAST 3X4 CTTN HI CHSV (CAST SUPPLIES) ×1 IMPLANT
PAD PREP 24X48 CUFFED NSTRL (MISCELLANEOUS) ×1 IMPLANT
PADDING CAST COTTON 3X4 STRL (CAST SUPPLIES) ×1
SHEET MEDIUM DRAPE 40X70 STRL (DRAPES) ×1 IMPLANT
SLING ARM FOAM STRAP LRG (SOFTGOODS) IMPLANT
SPIKE FLUID TRANSFER (MISCELLANEOUS) IMPLANT
SPLINT PLASTER CAST XFAST 4X15 (CAST SUPPLIES) ×1 IMPLANT
SPONGE SURGIFOAM ABS GEL 12-7 (HEMOSTASIS) IMPLANT
STOCKINETTE IMPERVIOUS LG (DRAPES) IMPLANT
SUCTION TUBE FRAZIER 10FR DISP (SUCTIONS) IMPLANT
SUT 0 FIBERLOOP 38 BLUE TPR ND (SUTURE)
SUT ETHIBOND 0 MO6 C/R (SUTURE) IMPLANT
SUT ETHILON 4 0 PS 2 18 (SUTURE) IMPLANT
SUT FIBERWIRE 4-0 18 DIAM BLUE (SUTURE) ×2
SUT MNCRL AB 3-0 PS2 27 (SUTURE) IMPLANT
SUT MNCRL AB 4-0 PS2 18 (SUTURE) IMPLANT
SUT VIC AB 3-0 PS2 18 (SUTURE) ×1
SUT VIC AB 3-0 PS2 18XBRD (SUTURE) IMPLANT
SUTURE 0 FIBERLP 38 BLU TPR ND (SUTURE) IMPLANT
SUTURE FIBERWR 4-0 18 DIA BLUE (SUTURE) IMPLANT
SYR BULB EAR ULCER 3OZ GRN STR (SYRINGE) ×2 IMPLANT
SYR CONTROL 10ML LL (SYRINGE) IMPLANT
TAPE SUT LABRALTAP WHT/BLK (SUTURE) IMPLANT
TOWEL GREEN STERILE FF (TOWEL DISPOSABLE) ×2 IMPLANT
TUBE CONNECTING 20X1/4 (TUBING) IMPLANT

## 2023-04-22 NOTE — Anesthesia Preprocedure Evaluation (Signed)
Anesthesia Evaluation  Patient identified by MRN, date of birth, ID band Patient awake    Reviewed: Allergy & Precautions, H&P , NPO status , Patient's Chart, lab work & pertinent test results  Airway Mallampati: II  TM Distance: >3 FB Neck ROM: Full    Dental no notable dental hx.    Pulmonary neg pulmonary ROS   Pulmonary exam normal breath sounds clear to auscultation       Cardiovascular hypertension, Pt. on medications negative cardio ROS Normal cardiovascular exam Rhythm:Regular Rate:Normal     Neuro/Psych negative neurological ROS  negative psych ROS   GI/Hepatic negative GI ROS, Neg liver ROS,,,  Endo/Other  negative endocrine ROS    Renal/GU Renal InsufficiencyRenal disease  negative genitourinary   Musculoskeletal negative musculoskeletal ROS (+)    Abdominal  (+) + obese  Peds negative pediatric ROS (+)  Hematology negative hematology ROS (+)   Anesthesia Other Findings   Reproductive/Obstetrics negative OB ROS                             Anesthesia Physical Anesthesia Plan  ASA: 2  Anesthesia Plan: MAC   Post-op Pain Management: Regional block* and Minimal or no pain anticipated   Induction: Intravenous  PONV Risk Score and Plan: 2 and Ondansetron, Midazolam and Treatment may vary due to age or medical condition  Airway Management Planned: Simple Face Mask  Additional Equipment:   Intra-op Plan:   Post-operative Plan:   Informed Consent: I have reviewed the patients History and Physical, chart, labs and discussed the procedure including the risks, benefits and alternatives for the proposed anesthesia with the patient or authorized representative who has indicated his/her understanding and acceptance.     Dental advisory given  Plan Discussed with: CRNA  Anesthesia Plan Comments:        Anesthesia Quick Evaluation

## 2023-04-22 NOTE — Discharge Instructions (Signed)
 Hand Surgery Postop Instructions   Dressings: Maintain postoperative dressing until orthopedic follow-up.  Keep operative site clean and dry until orthopedic follow-up.  Wound Care: Keep your hand elevated above the level of your heart.  Do not allow it to dangle by your side. Moving your fingers is advised to stimulate circulation but will depend on the site of your surgery.  If you have a splint applied, your doctor will advise you regarding movement.  Activity: Do not drive or operate machinery until clearance given from physician. No heavy lifting with operative extremity.  Diet:  Drink liquids today or eat a light diet.  You may resume a regular diet tomorrow.    General expectations: Take prescribed medication if given, transition to over-the-counter medication as quickly as possible. Fingers may become slightly swollen.  Call your doctor if any of the following occur: Severe pain not relieved by pain medication. Elevated temperature. Dressing soaked with blood. Inability to move fingers. White or bluish color to fingers.

## 2023-04-22 NOTE — Anesthesia Postprocedure Evaluation (Signed)
Anesthesia Post Note  Patient: Tara Rios  Procedure(s) Performed: RIGHT THUMB ULNAR COLLATERAL LIGAMENT RECONSTRUCTION (Right: Thumb)     Patient location during evaluation: PACU Anesthesia Type: MAC Level of consciousness: awake and alert Pain management: pain level controlled Vital Signs Assessment: post-procedure vital signs reviewed and stable Respiratory status: spontaneous breathing, nonlabored ventilation and respiratory function stable Cardiovascular status: blood pressure returned to baseline and stable Postop Assessment: no apparent nausea or vomiting Anesthetic complications: no   No notable events documented.  Last Vitals:  Vitals:   04/22/23 0945 04/22/23 1023  BP: 127/72 (!) 144/74  Pulse: 60 67  Resp: 13 20  Temp:  36.5 C  SpO2: 98% 99%    Last Pain:  Vitals:   04/22/23 1023  TempSrc: Temporal  PainSc: 0-No pain                 Lowella Curb

## 2023-04-22 NOTE — Transfer of Care (Signed)
Immediate Anesthesia Transfer of Care Note  Patient: Tara Rios  Procedure(s) Performed: RIGHT THUMB ULNAR COLLATERAL LIGAMENT RECONSTRUCTION (Right: Thumb)  Patient Location: PACU  Anesthesia Type:MAC combined with regional for post-op pain  Level of Consciousness: awake, alert , oriented, drowsy, and patient cooperative  Airway & Oxygen Therapy: Patient Spontanous Breathing and Patient connected to face mask oxygen  Post-op Assessment: Report given to RN and Post -op Vital signs reviewed and stable  Post vital signs: Reviewed and stable  Last Vitals:  Vitals Value Taken Time  BP    Temp    Pulse    Resp    SpO2      Last Pain:  Vitals:   04/22/23 0640  TempSrc: Temporal  PainSc:       Patients Stated Pain Goal: 3 (04/22/23 4098)  Complications: No notable events documented.

## 2023-04-22 NOTE — Op Note (Signed)
NAME: Tara Rios MEDICAL RECORD NO: [id number] DATE OF BIRTH: 16-Sep-1970 FACILITY: [facility] LOCATION: [facility] PHYSICIAN: Samuella Cota, MD   OPERATIVE REPORT   DATE OF PROCEDURE: 04/22/23    PREOPERATIVE DIAGNOSIS: Right thumb ulnar collateral ligament tear   POSTOPERATIVE DIAGNOSIS: Right thumb ulnar collateral ligament tear   PROCEDURE: Right thumb ulnar collateral ligament repair with internal brace augmentation Right thumb spica short arm splint application   SURGEON:  Samuella Cota, M.D.   ASSISTANT: None   ANESTHESIA:  Regional with sedation   INTRAVENOUS FLUIDS:  Per anesthesia flow sheet.   ESTIMATED BLOOD LOSS:  Minimal.   COMPLICATIONS:  None.   SPECIMENS:  none   TOURNIQUET TIME:    Total Tourniquet Time Documented: Upper Arm (Right) - 35 minutes Total: Upper Arm (Right) - 35 minutes    DISPOSITION:  Stable to PACU.   INDICATIONS: This is a 52 year old female who was seen in the outpatient setting after an injury sustained to the right hand.  She was seen by a colleague of mine, underwent clinical and diagnostic workup with MRI findings of right thumb ulnar collateral ligament tear.  She was referred to me for treatment.  In order to restore anatomy, joint stability, promote healing and early mobilization, patient was indicated for right thumb ulnar collateral ligament repair with internal brace augmentation.  Risks and benefits of surgery were discussed including the risks of infection, bleeding, scarring, stiffness, nerve injury, vascular injury, tendon injury, ongoing instability, need for subsequent operation, , recurrence.  She voiced understanding of these risks and elected to proceed.  OPERATIVE COURSE: Patient was seen and identified in the preoperative area and marked appropriately.  Surgical consent had been signed. Preoperative IV antibiotic prophylaxis was given. She was transferred to the operating room and placed in supine  position with the Right upper extremity on an arm board.  Sedation was induced by the anesthesiologist. A regional block had been performed by anesthesia in preoperative holding.    Right upper extremity was prepped and draped in normal sterile orthopedic fashion.  A surgical pause was performed between the surgeons, anesthesia, and operating room staff and all were in agreement as to the patient, procedure, and site of procedure.  Tourniquet was placed and padded appropriately.  A curvilinear incision was designed over the ulnar aspect of the thumb MCP joint.  Incision was carried down utilizing a 15 blade, blunt dissection was performed.  Care was taken to protect radial sensory nerve during exposure, EPL was also carefully protected.  Thumb MCP capsule was identified and incised sharply utilizing a Beaver blade.  We were able to identify the distal portion of the ulnar collateral ligament which had avulsed from the insertion on the thumb proximal phalanx and had retracted.  Collateral ligament was gently debrided and mobilized in order to allow for appropriate advancement to its attachment site at the proximal phalanx.  Once appropriate mobility had been achieved, we proceeded to place the K wires for the internal brace.  Wires were placed at the proximal phalanx and thumb metacarpal, at appropriate locations for insertion of the ulnar collateral ligament.  Wire placement was confirmed with biplanar fluoroscopy.  This was followed by drilling for both the proximal phalanx and metacarpal anchors.  FiberTape and 3-0 FiberWire was then placed in the for suture anchor which was inserted into the proximal phalanx and seated appropriately, excellent stability was noted.  3-0 FiberWire was then used for repair of the ulnar collateral ligament  in [place] fashion.  Once the ulnar collateral ligament had been reapproximated to the proximal phalanx appropriately, fiber tape of the internal brace was placed over the  repair site for augmentation purposes and seated into the metacarpal with use of the additional suture anchor.  Ulnar collateral ligament was noted to be stable under stress testing at both neutral and 30 degrees of flexion at this point.  Capsule and extensor cuff from the MCP joint was repaired with 3-0 Vicryl in mattress fashion.  Once again, excellent stability was noted to the ulnar collateral ligament with stress testing.  The tourniquet was deflated at 35 minutes.  Fingertips were pink with brisk capillary refill after deflation of tourniquet.  Copious irrigation was performed followed by layered closure utilizing 3-0 Monocryl and 4-0 nylon for the skin surface.  Sterile dressings were applied followed by application of a short arm thumb spica splint utilizing plaster.  The operative drapes were broken down.  The patient was awoken from anesthesia safely and taken to PACU in stable condition.  I will see her back in the office in 2 weeks for postoperative followup.     Samuella Cota, MD Electronically signed, 04/22/23

## 2023-04-22 NOTE — Anesthesia Procedure Notes (Signed)
Anesthesia Regional Block: Supraclavicular block   Pre-Anesthetic Checklist: , timeout performed,  Correct Patient, Correct Site, Correct Laterality,  Correct Procedure, Correct Position, site marked,  Risks and benefits discussed,  Surgical consent,  Pre-op evaluation,  At surgeon's request and post-op pain management  Laterality: Right  Prep: chloraprep       Needles:  Injection technique: Single-shot  Needle Type: Stimiplex     Needle Length: 9cm  Needle Gauge: 21     Additional Needles:   Procedures:,,,, ultrasound used (permanent image in chart),,    Narrative:  Start time: 04/22/2023 7:07 AM End time: 04/22/2023 7:12 AM Injection made incrementally with aspirations every 5 mL.  Performed by: Personally  Anesthesiologist: Lowella Curb, MD

## 2023-04-22 NOTE — Interval H&P Note (Signed)
History and Physical Interval Note:  04/22/2023 6:58 AM  Tara Rios  has presented today for surgery, with the diagnosis of RIGHT THUMB ULNAR COLLATERAL LIGAMENT TEAR.  The various methods of treatment have been discussed with the patient and family. After consideration of risks, benefits and other options for treatment, the patient has consented to  Procedure(s): RIGHT THUMB ULNAR COLLATERAL LIGAMENT RECONSTRUCTION (Right) as a surgical intervention.  The patient's history has been reviewed, patient examined, no change in status, stable for surgery.  I have reviewed the patient's chart and labs.  Questions were answered to the patient's satisfaction.     Rilyn Upshaw

## 2023-04-23 ENCOUNTER — Encounter (HOSPITAL_BASED_OUTPATIENT_CLINIC_OR_DEPARTMENT_OTHER): Payer: Self-pay | Admitting: Orthopedic Surgery

## 2023-04-23 ENCOUNTER — Encounter: Payer: Self-pay | Admitting: Orthopedic Surgery

## 2023-04-29 ENCOUNTER — Other Ambulatory Visit (INDEPENDENT_AMBULATORY_CARE_PROVIDER_SITE_OTHER): Payer: Self-pay | Admitting: Orthopedic Surgery

## 2023-04-30 ENCOUNTER — Other Ambulatory Visit: Payer: Self-pay | Admitting: Orthopedic Surgery

## 2023-04-30 ENCOUNTER — Telehealth: Payer: Self-pay | Admitting: Orthopedic Surgery

## 2023-04-30 DIAGNOSIS — S63629A Sprain of interphalangeal joint of unspecified thumb, initial encounter: Secondary | ICD-10-CM

## 2023-04-30 MED ORDER — OXYCODONE HCL 5 MG PO TABS: 5.0000 mg | ORAL_TABLET | ORAL | 0 refills | Status: AC | PRN

## 2023-04-30 MED ORDER — HYDROCODONE-ACETAMINOPHEN 5-325 MG PO TABS
1.0000 | ORAL_TABLET | ORAL | 0 refills | Status: DC | PRN
Start: 2023-04-30 — End: 2023-04-30

## 2023-04-30 NOTE — Telephone Encounter (Signed)
Patient called asked if she can get something for pain called into her pharmacy.  The number to contact patient is [phone]

## 2023-05-05 ENCOUNTER — Ambulatory Visit (INDEPENDENT_AMBULATORY_CARE_PROVIDER_SITE_OTHER): Payer: BC Managed Care – PPO | Admitting: Orthopedic Surgery

## 2023-05-05 DIAGNOSIS — S63621A Sprain of interphalangeal joint of right thumb, initial encounter: Secondary | ICD-10-CM

## 2023-05-05 DIAGNOSIS — S63629A Sprain of interphalangeal joint of unspecified thumb, initial encounter: Secondary | ICD-10-CM

## 2023-05-05 NOTE — Progress Notes (Signed)
   Tara Rios - 52 y.o. female MRN [id number]  Date of birth: [birth year]  Office Visit Note: Visit Date: 05/05/2023 PCP: Mirna Mires, MD Referred by: Mirna Mires, MD  Subjective:  HPI: Tara Rios is a 52 y.o. female who presents today for follow up 2 weeks status post right thumb UCL repair .  She is doing well overall, pain is well-controlled, she has started to use over-the-counter modalities.  Denies any numbness or tingling.  Pertinent ROS were reviewed with the patient and found to be negative unless otherwise specified above in HPI.   Assessment & Plan: Visit Diagnoses: No diagnosis found.  Plan: She is doing well overall, demonstrates appropriate healing of the incisional site today.  Sutures removed without incident.  Thumb spica cast applied for additional 2 weeks.  She will be seen back in 2 weeks for cast removal, and will be seen by occupational therapy for custom thumb spica splint and begin range of motion activities.  Will wait until the 8-week postoperative mark to begin strengthening.  Follow-up: No follow-ups on file.   Meds & Orders: No orders of the defined types were placed in this encounter.  No orders of the defined types were placed in this encounter.    Procedures: No procedures performed       Objective:   Vital Signs: LMP 01/08/2023 (Approximate)   Ortho Exam Right thumb: - Well-healing incision over the ulnar aspect of the MCP, sutures in place, skin edges well-approximated, no erythema or drainage - Gentle thumb circumduction without significant pain - Sensation intact distally to the thumb, thumb is warm well-perfused  Imaging: No results found.   Breanne Olvera Trevor Mace, M.D. [facility] 9:51 AM

## 2023-05-16 NOTE — Therapy (Signed)
OUTPATIENT OCCUPATIONAL THERAPY ORTHO EVALUATION  Patient Name: Tara Rios MRN: [id number] DOB:[birth year], 52 y.o., female Today's Date: 05/19/2023  PCP: Mirna Mires, MD REFERRING PROVIDER:  Samuella Cota, MD    END OF SESSION:  OT End of Session - 05/19/23 1525     Visit Number 1    Number of Visits 10    Date for OT Re-Evaluation 07/04/23    Authorization Type BCBS    OT Start Time 1535    OT Stop Time 1627    OT Time Calculation (min) 52 min    Equipment Utilized During Treatment orthotic materials    Activity Tolerance Patient tolerated treatment well;No increased pain;Patient limited by fatigue;Patient limited by pain    Behavior During Therapy [facility] for tasks assessed/performed;Anxious             Past Medical History:  Diagnosis Date   Hypertension    Past Surgical History:  Procedure Laterality Date   TONSILLECTOMY     ULNAR COLLATERAL LIGAMENT REPAIR Right 04/22/2023   Procedure: RIGHT THUMB ULNAR COLLATERAL LIGAMENT RECONSTRUCTION;  Surgeon: Samuella Cota, MD;  Location: [facility];  Service: Orthopedics;  Laterality: Right;  regional block   Patient Active Problem List   Diagnosis Date Noted   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 joint of thumb 04/22/2023   Abnormal Pap smear of cervix 10/21/2017   Trichomonas infection 05/15/2017   Hypertension    BURSITIS, OLECRANON 03/13/2009    ONSET DATE: DOS 04/22/23  REFERRING DIAG: W09.811B (ICD-10-CM) - 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 joint of thumb   THERAPY DIAG:  Pain in right hand - Plan: Ot plan of care cert/re-cert  Localized edema - Plan: Ot plan of care cert/re-cert  Muscle weakness (generalized) - Plan: Ot plan of care cert/re-cert  Other lack of coordination - Plan: Ot plan of care cert/re-cert  Stiffness of right hand, not elsewhere classified - Plan: Ot plan of care cert/re-cert  Rationale for Evaluation and  Treatment: Rehabilitation  SUBJECTIVE:   SUBJECTIVE STATEMENT: She is ~4 weeks post Rt thumb UCL repair. She states falling and thinking she broke her thumb, but eventually got MRI showing UCL tear. She had repair and has had fairly high ain reports and guarding from pain since.  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     PERTINENT HISTORY: Per MD notes: "Splinting appointment and initiate hand therapy; S/P UCL repair"   PRECAUTIONS: None  RED FLAGS: None   WEIGHT BEARING RESTRICTIONS: Yes no lifting until week 8 post-op  PAIN:  Are you having pain? Yes: NPRS scale: 6/10 at rest and in the past week at worst up to 7-8/10 Pain location: Rt thumb sx area  Pain description: Sharp and aching Aggravating factors: Touching or moving Relieving factors: Rest  FALLS: Has patient fallen in last 6 months? Yes. Number of falls 1 (this accident- not a fall risk)   LIVING ENVIRONMENT: Lives with: lives with their family Has following equipment at home: None  PLOF: Independent  PATIENT GOALS: To safely improve the use of her right dominant hand-wrist and thumb  NEXT MD VISIT: 06/13/23   OBJECTIVE: (All objective assessments below are from initial evaluation on: 05/19/23 unless otherwise specified.)   HAND DOMINANCE: Right   ADLs: Overall ADLs: States decreased ability to grab, hold household objects, pain and difficulty to open containers, perform FMS tasks (manipulate fasteners on clothing), mild to moderate bathing problems as well.  FUNCTIONAL OUTCOME MEASURES: Eval: TBD Quick DASH when time allows   UPPER EXTREMITY ROM     Shoulder to Wrist AROM Right eval  Forearm supination ~75  Forearm pronation  ~75  Wrist flexion 65  Wrist extension 48  (Blank rows = not tested)   Hand AROM Right eval  Full Fist Ability (or Gap to Distal Palmar Crease) 2cm gap to full fist from tips of fingers to [facility]  Thumb Opposition  (Kapandji Scale)  1/10  Thumb MCP  (0-60) 0- 24  Thumb IP (0-80) 0- 16   Thumb Radial Abduction Span   Thumb Palmar Abduction Span   (Blank rows = not tested)   UPPER EXTREMITY MMT:    Eval:  NT at eval due to recent and still healing injuries. Will be tested when appropriate.   MMT Right TBD  Shoulder flexion   Shoulder abduction   Shoulder adduction   Shoulder extension   Shoulder internal rotation   Shoulder external rotation   Middle trapezius   Lower trapezius   Elbow flexion   Elbow extension   Forearm supination   Forearm pronation   Wrist flexion   Wrist extension   Wrist ulnar deviation   Wrist radial deviation   (Blank rows = not tested)  HAND FUNCTION: Eval: Observed weakness in affected Rt hand.  Details will be tested when safe Grip strength Right: TBD lbs, Left: TBD lbs   COORDINATION: Eval: Observed coordination impairments with affected Rt hand.  Details will be tested when time allows 9 Hole Peg Test Right: TBDsec, (approx 22 sec is WFL)   SENSATION: Eval:  Light touch intact today distally, though diminished around sx area .  She also shows some significant hypersensitivity/allodynia around the scar area  EDEMA:   Eval:  Mildly swollen in Rt hand and thumb today  COGNITION: Eval: Overall cognitive status: WFL for evaluation today   OBSERVATIONS:   Eval: she is overtly hypersensitive and guarded today. She can barely tolerate a light touch, she can also barely move at the [facility] or MCP joints of the right thumb.  IP joint is also extremely stiff   TODAY'S TREATMENT:  Post-evaluation treatment:   She was given self-care education to be moving her forearm shoulder wrist and thumb IP as well as her fingers is much as she safely feels comfortable with to prevent more problems and more stiffness.  These things are not going to be immobilized in any kind of brace, but she has been very stiff and guarded with hypersensitivity and pain.  She was strongly cautioned against this nature if she  can help it.   Custom orthotic fabrication was indicated due to pt's healing thumb ligament repair and need for safe, functional positioning. OT fabricated custom hand-based thumb spica orthosis for pt today to rest surgical repair and allow healing. It fit well with no areas of pressure, pt states a comfortable fit. Pt was educated on the wearing schedule (on at all times except for hygiene and exercises), to avoid exposing it to sources of heat, to wipe clean as needed (do not wash, use harsh detergents), to call or come in ASAP if it is causing any irritation or is not achieving desired function. It will be checked/adjusted in upcoming sessions, as needed. Pt states understanding all directions.    Additionally she was given following home exercise program which was demonstrated to her explained to her and she performs back.  She was nervous and anxious to attempt to  move her thumb but she did not have any significant increase in pain.  She tolerates well and leaves with no additional pain.  Exercises - Reach arms upward   - 4-6 x daily - 10 reps - Turn [employer] Facing Up & Down  - 4-6 x daily - 10-15 reps - Bend and Pull Back Wrist SLOWLY  - 4 x daily - 10-15 reps - Tendon Glides  - 4-6 x daily - 3-5 reps - 2-3 seconds hold - Thumb AROM IP Blocking  - 4-6 x daily - 10-15 reps - Seated Composite Thumb Flexion AROM  - 4-6 x daily - 1 sets - 10-15 reps -Desensitization and scar mobilization touching, 5 minutes, 4 times a day   PATIENT EDUCATION: Education details: See tx section above for details  Person educated: Patient Education method: Engineer, structural, Teach back, Handouts  Education comprehension: States and demonstrates understanding, Additional Education required    HOME EXERCISE PROGRAM: Access Code: [email or web address] URL: [email or web address] Date: 05/19/2023 Prepared by: Fannie Knee   GOALS: Goals reviewed with patient? Yes   SHORT TERM GOALS: (STG required if  POC>30 days) Target Date: 06/06/23  Pt will obtain protective, custom orthotic. Goal status: MET   2.  Pt will demo/state understanding of initial HEP to improve pain levels and prerequisite motion. Goal status: INITIAL   LONG TERM GOALS: Target Date: 07/04/23  Pt will improve functional ability by decreased impairment per Quick DASH assessment from TBD to TBD or better, for better quality of life. Goal status: INITIAL  2.  Pt will improve grip strength in right hand to at least 35 lbs for functional use at home and in IADLs. Goal status: INITIAL  3.  Pt will improve A/ROM in right thumb MCP and IP joints flexion from 24* / 16* to at least 45*  / 50* respectively, to have functional motion for tasks like reach and grasp.  Goal status: INITIAL  4.  Pt will improve strength in right thumb from painful 3 -/5 MMT to at least 4+/5 MMT to have increased functional ability to carry out selfcare and higher-level homecare tasks with less difficulty. Goal status: INITIAL  5.  Pt will improve coordination skills in right hand, as seen by [facility] score on nine-hole peg test testing to have increased functional ability to carry out fine motor tasks (fasteners, etc.) and more complex, coordinated IADLs (meal prep, sports, etc.).  Goal status: INITIAL  6.  Pt will decrease pain at worst from 8/10 to 3/10 or better to have better sleep and occupational participation in daily roles. Goal status: INITIAL   ASSESSMENT:  CLINICAL IMPRESSION: Patient is a 52 y.o. female who was seen today for occupational therapy evaluation for right dominant thumb UCL tear and subsequent repair, postsurgical pain and stiffness and swelling, allodynia and fear of motion-all contributing to decreased activities of daily living and functional ability.  She will benefit from outpatient occupational therapy to increase functional skills and quality of life.   PERFORMANCE DEFICITS: in functional skills including ADLs, IADLs,  coordination, dexterity, proprioception, sensation, edema, ROM, strength, pain, fascial restrictions, muscle spasms, flexibility, Fine motor control, Gross motor control, body mechanics, endurance, decreased knowledge of precautions, wound, and UE functional use, cognitive skills including problem solving and safety awareness, and psychosocial skills including coping strategies, environmental adaptation, and habits.   IMPAIRMENTS: are limiting patient from ADLs, IADLs, rest and sleep, work, leisure, and social participation.   COMORBIDITIES: may have co-morbidities  that affects occupational performance. Patient will  benefit from skilled OT to address above impairments and improve overall function.  MODIFICATION OR ASSISTANCE TO COMPLETE EVALUATION: No modification of tasks or assist necessary to complete an evaluation.  OT OCCUPATIONAL PROFILE AND HISTORY: Problem focused assessment: Including review of records relating to presenting problem.  CLINICAL DECISION MAKING: LOW - limited treatment options, no task modification necessary  REHAB POTENTIAL: Excellent  EVALUATION COMPLEXITY: Low      PLAN:  OT FREQUENCY: 1-2x/week  OT DURATION: 6 weeks through 07/04/23 and up to 10 visits   PLANNED INTERVENTIONS: self care/ADL training, therapeutic exercise, therapeutic activity, neuromuscular re-education, manual therapy, scar mobilization, passive range of motion, splinting, electrical stimulation, ultrasound, fluidotherapy, compression bandaging, moist heat, cryotherapy, contrast bath, patient/family education, coping strategies training, DME and/or AE instructions, Re-evaluation, and Dry needling  RECOMMENDED OTHER SERVICES: none now   CONSULTED AND AGREED WITH PLAN OF CARE: Patient  PLAN FOR NEXT SESSION:   Check orthosis as needed as well as original recommendations, check motion and sensitivity to touch and upgrade as tolerated. Stretching @5  weeks, weaning at 6-7 weeks, strength at 8  weeks.    Fannie Knee, OTR/L, CHT 05/19/2023, 4:51 PM

## 2023-05-19 ENCOUNTER — Encounter: Payer: Self-pay | Admitting: Rehabilitative and Restorative Service Providers"

## 2023-05-19 ENCOUNTER — Other Ambulatory Visit: Payer: Self-pay

## 2023-05-19 ENCOUNTER — Ambulatory Visit (INDEPENDENT_AMBULATORY_CARE_PROVIDER_SITE_OTHER): Payer: BC Managed Care – PPO | Admitting: Orthopedic Surgery

## 2023-05-19 ENCOUNTER — Ambulatory Visit (INDEPENDENT_AMBULATORY_CARE_PROVIDER_SITE_OTHER): Payer: BC Managed Care – PPO | Admitting: Rehabilitative and Restorative Service Providers"

## 2023-05-19 DIAGNOSIS — R278 Other lack of coordination: Secondary | ICD-10-CM

## 2023-05-19 DIAGNOSIS — M79641 Pain in right hand: Secondary | ICD-10-CM

## 2023-05-19 DIAGNOSIS — M25641 Stiffness of right hand, not elsewhere classified: Secondary | ICD-10-CM

## 2023-05-19 DIAGNOSIS — M6281 Muscle weakness (generalized): Secondary | ICD-10-CM

## 2023-05-19 DIAGNOSIS — S63629A Sprain of interphalangeal joint of unspecified thumb, initial encounter: Secondary | ICD-10-CM

## 2023-05-19 DIAGNOSIS — R600 Localized edema: Secondary | ICD-10-CM

## 2023-05-19 NOTE — Progress Notes (Signed)
Tara Rios - 52 y.o. female MRN [id number]  Date of birth: 06-01-1971  Office Visit Note: Visit Date: 05/19/2023 PCP: Mirna Mires, MD Referred by: Mirna Mires, MD  Subjective:  HPI: Tara Rios is a 52 y.o. female who presents today for follow up 4 weeks status post right thumb ulnar collateral ligament reconstruction.  She is doing well overall, has been compliant with casting as instructed.  Pertinent ROS were reviewed with the patient and found to be negative unless otherwise specified above in HPI.   Assessment & Plan: Visit Diagnoses: No diagnosis found.  Plan: She will be seen by occupational therapy today and transition to a hand-based thumb spica orthosis.  Can begin range of motion exercises of the left thumb.  Will plan to progress to strengthening at week 8 postoperatively.  I will plan on seeing her back in 4 weeks to track her progress.  Follow-up: No follow-ups on file.   Meds & Orders: No orders of the defined types were placed in this encounter.  No orders of the defined types were placed in this encounter.    Procedures: No procedures performed       Objective:   Vital Signs: There were no vitals taken for this visit.  Ortho Exam Left hand: - Well-healed incision over the thumb ulnar collateral ligament region, skin edges well-approximated without erythema or drainage - Gentle stress testing at both neutral and 30 degrees of flexion at MCP with appropriate stability - Sensation intact distally, thumb is warm well-perfused  Imaging: No results found.   Yassmin Binegar Trevor Mace, M.D. [facility] 2:26 PM

## 2023-05-19 NOTE — Therapy (Signed)
OUTPATIENT OCCUPATIONAL THERAPY TREATMENT NOTE   Patient Name: Tara Rios MRN: [id number] DOB:[birth year], 52 y.o., female Today's Date: 05/21/2023  PCP: Mirna Mires, MD REFERRING PROVIDER:  Samuella Cota, MD    END OF SESSION:  OT End of Session - 05/21/23 1431     Visit Number 2    Number of Visits 10    Date for OT Re-Evaluation 07/04/23    Authorization Type BCBS    OT Start Time 1431    OT Stop Time 1516    OT Time Calculation (min) 45 min    Equipment Utilized During Treatment --    Activity Tolerance Patient tolerated treatment well;No increased pain;Patient limited by fatigue;Patient limited by pain    Behavior During Therapy [facility] for tasks assessed/performed;Anxious              Past Medical History:  Diagnosis Date   Hypertension    Past Surgical History:  Procedure Laterality Date   TONSILLECTOMY     ULNAR COLLATERAL LIGAMENT REPAIR Right 04/22/2023   Procedure: RIGHT THUMB ULNAR COLLATERAL LIGAMENT RECONSTRUCTION;  Surgeon: Samuella Cota, MD;  Location: [facility];  Service: Orthopedics;  Laterality: Right;  regional block   Patient Active Problem List   Diagnosis Date Noted   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 joint of thumb 04/22/2023   Abnormal Pap smear of cervix 10/21/2017   Trichomonas infection 05/15/2017   Hypertension    BURSITIS, OLECRANON 03/13/2009    ONSET DATE: DOS 04/22/23  REFERRING DIAG: X91.478G (ICD-10-CM) - 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 joint of thumb   THERAPY DIAG:  Localized edema  Muscle weakness (generalized)  Other lack of coordination  Stiffness of right hand, not elsewhere classified  Pain in right hand  Rationale for Evaluation and Treatment: Rehabilitation  PERTINENT HISTORY: Per MD notes: "Splinting appointment and initiate hand therapy; S/P UCL repair"  She states falling and thinking she broke her thumb, but eventually got  MRI showing UCL tear. She had repair and has had fairly high ain reports and guarding from pain since.  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   PRECAUTIONS: None  RED FLAGS: None   WEIGHT BEARING RESTRICTIONS: Yes no lifting until week 8 post-op     SUBJECTIVE:   SUBJECTIVE STATEMENT: She is 4 weeks post Rt thumb UCL repair.  She states that her orthosis needs a slight adjustment in the palm.  She also states that her scar was bleeding a bit and it worried her so she did not exercises much as asked yesterday.     PAIN:  Are you having pain? Yes: NPRS scale: 0/10 at rest and in the past week at worst up to 5/10 Pain location: Rt thumb sx area  Pain description: Sharp and aching Aggravating factors: Touching or moving Relieving factors: Rest   PATIENT GOALS: To safely improve the use of her right dominant hand-wrist and thumb  NEXT MD VISIT: 06/13/23    OBJECTIVE: (All objective assessments below are from initial evaluation on: 05/19/23 unless otherwise specified.)   HAND DOMINANCE: Right   ADLs: Overall ADLs: States decreased ability to grab, hold household objects, pain and difficulty to open containers, perform FMS tasks (manipulate fasteners on clothing), mild to moderate bathing problems as well.    FUNCTIONAL OUTCOME MEASURES: 05/21/23: Quick DASH: 54.5% impairment today    UPPER EXTREMITY ROM     Shoulder to Wrist AROM Right eval Rt 05/22/23  Forearm supination ~75   Forearm pronation  ~75   Wrist flexion 65 67  Wrist extension 48 50  (Blank rows = not tested)   Hand AROM Right eval Rt 05/21/23  Full Fist Ability (or Gap to Distal Palmar Crease) 2cm gap to full fist from tips of fingers to [facility]   Thumb Opposition  (Kapandji Scale)  1/10 1 /10   Thumb MCP (0-60) 0- 24 0 - 26  Thumb IP (0-80) 0- 16  0 - 9  Thumb Radial Abduction Span    Thumb Palmar Abduction Span    (Blank rows = not tested)   UPPER EXTREMITY MMT:    Eval:  NT  at eval due to recent and still healing injuries. Will be tested when appropriate.   MMT Right TBD  Elbow flexion   Elbow extension   Forearm supination   Forearm pronation   Wrist flexion   Wrist extension   Wrist ulnar deviation   Wrist radial deviation   (Blank rows = not tested)  HAND FUNCTION: Eval: Observed weakness in affected Rt hand.  Details will be tested when safe Grip strength Right: TBD lbs, Left: TBD lbs   COORDINATION: 05/21/23: 9 Hole Peg Test Right: 1 min 36sec, (approx 22 sec is WFL)   Eval: Observed coordination impairments with affected Rt hand.  Details will be tested when time allows   SENSATION: Eval:  Light touch intact today distally, though diminished around sx area .  She also shows some significant hypersensitivity/allodynia around the scar area  EDEMA:   Eval:  Mildly swollen in Rt hand and thumb today  OBSERVATIONS:   Eval: she is overtly hypersensitive and guarded today. She can barely tolerate a light touch, she can also barely move at the [facility] or MCP joints of the right thumb.  IP joint is also extremely stiff (S/P UCL repair)    TODAY'S TREATMENT:  05/21/23: She performs range of motion for a check and it is only up very slightly at the wrist and not up significantly in the finger, likely due to not removing the orthosis to do exercises as asked.  Today she is not bleeding at her scar line, but there is some small areas of crusted dried blood.  Her skin is dry and flaking.  OT reminds her to keep her hand moisturized, and does this for her today.  OT then encourages her that even if she has a small bleeding area she should put on a Band-Aid and continue with home exercise program.  She is very nervous and guarded, flinching and grimacing throughout the session.  Admittedly though she has no increase in pain and this seems more like anxiety.  She reviews her daily routines with OT via the QuickDASH, and she also performs fine motor skill nine-hole peg  test activity which takes her quite a long time due to fear, guarding, stiffness, pain.  OT modified her orthosis and she stated it feels much better after that and no areas of pain or pressure.  OT also reviews her home exercise program with her and adjusts it to include light PROM of the thumb to be done passively by herself before she attempts to move.  OT does this with her manually today and she seems to improve her flexion and extension of her thumb afterwards.  She also reviews joint blocking range of motion to the IP joint which seems to improve her motion there as well after repeated attempts.  It seems that  she just needs to practice more and keep moving to improve her ability and decrease her anxiety.  She has no pain at the end of the session as she leaves  Exercises - Reach arms upward   - 4-6 x daily - 10 reps - Turn [employer] Facing Up & Down  - 4-6 x daily - 10-15 reps - Bend and Pull Back Wrist SLOWLY  - 4 x daily - 10-15 reps - Tendon Glides  - 4-6 x daily - 3-5 reps - 2-3 seconds hold - Thumb AROM IP Blocking  - 4-6 x daily - 10-15 reps - Seated Composite Thumb Flexion AROM  - 4-6 x daily - 1 sets - 10-15 reps - Thumb Opposition  - 4-6 x daily - 10 reps     PATIENT EDUCATION: Education details: See tx section above for details  Person educated: Patient Education method: Engineer, structural, Teach back, Handouts  Education comprehension: States and demonstrates understanding, Additional Education required    HOME EXERCISE PROGRAM: Access Code: [email or web address] URL: [email or web address] Date: 05/19/2023 Prepared by: Fannie Knee   GOALS: Goals reviewed with patient? Yes   SHORT TERM GOALS: (STG required if POC>30 days) Target Date: 06/06/23  Pt will obtain protective, custom orthotic. Goal status: MET   2.  Pt will demo/state understanding of initial HEP to improve pain levels and prerequisite motion. Goal status: INITIAL   LONG TERM GOALS: Target  Date: 07/04/23  Pt will improve functional ability by decreased impairment per Quick DASH assessment from 54% to <20% or better, for better quality of life. Goal status: INITIAL  2.  Pt will improve grip strength in right hand to at least 35 lbs for functional use at home and in IADLs. Goal status: INITIAL  3.  Pt will improve A/ROM in right thumb MCP and IP joints flexion from 24* / 16* to at least 45*  / 50* respectively, to have functional motion for tasks like reach and grasp.  Goal status: INITIAL  4.  Pt will improve strength in right thumb from painful 3 -/5 MMT to at least 4+/5 MMT to have increased functional ability to carry out selfcare and higher-level homecare tasks with less difficulty. Goal status: INITIAL  5.  Pt will improve coordination skills in right hand, as seen by [facility] score on nine-hole peg test testing to have increased functional ability to carry out fine motor tasks (fasteners, etc.) and more complex, coordinated IADLs (meal prep, sports, etc.).  Goal status: INITIAL  6.  Pt will decrease pain at worst from 8/10 to 3/10 or better to have better sleep and occupational participation in daily roles. Goal status: INITIAL   ASSESSMENT:  CLINICAL IMPRESSION: 05/21/23: She remains very fearful and guarded as well as anxious with stiffness to the thumb and wrist.  She does better throughout the course of the session showing better ability to touch to the middle finger now and bend and extend her thumb but this needs to be consistent habit for her at home to exercise.  She was cautioned that if she is not moving at least 4-5 times a day as asked, she will get very stiff and continue to have more and more pain.    PLAN:  OT FREQUENCY: 1-2x/week  OT DURATION: 6 weeks through 07/04/23 and up to 10 visits   PLANNED INTERVENTIONS: self care/ADL training, therapeutic exercise, therapeutic activity, neuromuscular re-education, manual therapy, scar mobilization, passive range  of motion, splinting, electrical stimulation, ultrasound, fluidotherapy, compression bandaging, moist heat, cryotherapy,  contrast bath, patient/family education, coping strategies training, DME and/or AE instructions, Re-evaluation, and Dry needling  RECOMMENDED OTHER SERVICES: none now   CONSULTED AND AGREED WITH PLAN OF CARE: Patient  PLAN FOR NEXT SESSION:   check motion and sensitivity to touch and upgrade as tolerated.  Keep performing fine motor skills and functional activities to encourage motion as she is able.  Stretching @5  weeks, weaning at 6-7 weeks, strength at 8 weeks.    Fannie Knee, OTR/L, CHT 05/21/2023, 4:46 PM

## 2023-05-21 ENCOUNTER — Encounter: Payer: Self-pay | Admitting: Rehabilitative and Restorative Service Providers"

## 2023-05-21 ENCOUNTER — Ambulatory Visit (INDEPENDENT_AMBULATORY_CARE_PROVIDER_SITE_OTHER): Payer: BC Managed Care – PPO | Admitting: Rehabilitative and Restorative Service Providers"

## 2023-05-21 DIAGNOSIS — M6281 Muscle weakness (generalized): Secondary | ICD-10-CM | POA: Diagnosis not present

## 2023-05-21 DIAGNOSIS — M79641 Pain in right hand: Secondary | ICD-10-CM

## 2023-05-21 DIAGNOSIS — R600 Localized edema: Secondary | ICD-10-CM

## 2023-05-21 DIAGNOSIS — R278 Other lack of coordination: Secondary | ICD-10-CM

## 2023-05-21 DIAGNOSIS — M25641 Stiffness of right hand, not elsewhere classified: Secondary | ICD-10-CM | POA: Diagnosis not present

## 2023-05-23 NOTE — Therapy (Signed)
OUTPATIENT OCCUPATIONAL THERAPY TREATMENT NOTE   Patient Name: Tara Rios MRN: [id number] DOB:1971/02/21, 52 y.o., female Today's Date: 05/26/2023  PCP: Mirna Mires, MD REFERRING PROVIDER:  Samuella Cota, MD    END OF SESSION:  OT End of Session - 05/26/23 1501     Visit Number 3    Number of Visits 10    Date for OT Re-Evaluation 07/04/23    Authorization Type BCBS    OT Start Time 1516    OT Stop Time 1601    OT Time Calculation (min) 45 min    Activity Tolerance Patient tolerated treatment well;No increased pain;Patient limited by fatigue    Behavior During Therapy [facility] for tasks assessed/performed               Past Medical History:  Diagnosis Date   Hypertension    Past Surgical History:  Procedure Laterality Date   TONSILLECTOMY     ULNAR COLLATERAL LIGAMENT REPAIR Right 04/22/2023   Procedure: RIGHT THUMB ULNAR COLLATERAL LIGAMENT RECONSTRUCTION;  Surgeon: Samuella Cota, MD;  Location: [facility];  Service: Orthopedics;  Laterality: Right;  regional block   Patient Active Problem List   Diagnosis Date Noted   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 joint of thumb 04/22/2023   Abnormal Pap smear of cervix 10/21/2017   Trichomonas infection 05/15/2017   Hypertension    BURSITIS, OLECRANON 03/13/2009    ONSET DATE: DOS 04/22/23  REFERRING DIAG: W09.811B (ICD-10-CM) - 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 joint of thumb   THERAPY DIAG:  Localized edema  Muscle weakness (generalized)  Pain in right hand  Stiffness of right hand, not elsewhere classified  Other lack of coordination  Rationale for Evaluation and Treatment: Rehabilitation  PERTINENT HISTORY: Per MD notes: "Splinting appointment and initiate hand therapy; S/P UCL repair"  She states falling and thinking she broke her thumb, but eventually got MRI showing UCL tear. She had repair and has had fairly high ain reports  and guarding from pain since.  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   PRECAUTIONS: None  RED FLAGS: None   WEIGHT BEARING RESTRICTIONS: Yes no lifting until week 8 post-op     SUBJECTIVE:   SUBJECTIVE STATEMENT: She is ~5 weeks post Rt thumb UCL repair.  She states having little to no pain now, and demonstrates the ability to oppose to her small finger which is an excellent improvement.  She does admit to not wearing her orthotic at times and lightly stretching on her fingers without pain.  She feels more stiff than painful at this point in her words    PAIN:  Are you having pain?  Yes: NPRS scale: 4/10 at rest Pain location: Rt thumb sx area  Pain description: Sharp and aching Aggravating factors: Touching or moving Relieving factors: Rest   PATIENT GOALS: To safely improve the use of her right dominant hand-wrist and thumb  NEXT MD VISIT: 06/13/23    OBJECTIVE: (All objective assessments below are from initial evaluation on: 05/19/23 unless otherwise specified.)   HAND DOMINANCE: Right   ADLs: Overall ADLs: States decreased ability to grab, hold household objects, pain and difficulty to open containers, perform FMS tasks (manipulate fasteners on clothing), mild to moderate bathing problems as well.    FUNCTIONAL OUTCOME MEASURES: 05/21/23: Quick DASH: 54.5% impairment today    UPPER EXTREMITY ROM     Shoulder to Wrist AROM Right eval Rt 05/22/23 Rt 05/26/23  Forearm supination ~75    Forearm pronation  ~75    Wrist flexion 65 67 77  Wrist extension 48 50 52  (Blank rows = not tested)   Hand AROM Right eval Rt 05/21/23 Rt 05/26/23  Full Fist Ability (or Gap to Distal Palmar Crease) 2cm gap to full fist from tips of fingers to [facility]    Thumb Opposition  (Kapandji Scale)  1/10 1 /10  6 /10  Thumb MCP (0-60) 0- 24 0 - 26 0 - 41  Thumb IP (0-80) 0- 16  0 - 9 0 - 21  Thumb Radial Abduction Span     Thumb Palmar Abduction Span     (Blank  rows = not tested)   UPPER EXTREMITY MMT:    Eval:  NT at eval due to recent and still healing injuries. Will be tested when appropriate.   MMT Right TBD  Elbow flexion   Elbow extension   Forearm supination   Forearm pronation   Wrist flexion   Wrist extension   Wrist ulnar deviation   Wrist radial deviation   (Blank rows = not tested)  HAND FUNCTION: Eval: Observed weakness in affected Rt hand.  Details will be tested when safe Grip strength Right: TBD lbs, Left: TBD lbs   COORDINATION: 05/21/23: 9 Hole Peg Test Right: 1 min 36sec, (approx 22 sec is WFL)   Eval: Observed coordination impairments with affected Rt hand.  Details will be tested when time allows   SENSATION: Eval:  Light touch intact today distally, though diminished around sx area .  She also shows some significant hypersensitivity/allodynia around the scar area  EDEMA:   Eval:  Mildly swollen in Rt hand and thumb today  OBSERVATIONS:   Eval: she is overtly hypersensitive and guarded today. She can barely tolerate a light touch, she can also barely move at the [facility] or MCP joints of the right thumb.  IP joint is also extremely stiff (S/P UCL repair)    TODAY'S TREATMENT:  05/26/23: She performs active range of motion for new measures which show excellent improvement at the thumb and wrist flexion but not in wrist extension.  As she feels more stiff than painful, OT upgrades her home exercises to include some new wrist stretches in flexion and especially extension due to the tightness there as well as a forearm supination stretch and gentle composite thumb stretches as listed below.  She can also start to perform circumduction putting a light stress with range of motion into palmar abduction now.  Additionally, we reviewed ways she can stretch her fingers that she states they are stiff from even prior immobilization.  OT also supplies her with green therapy putty for working her fingers only.  It was stressed to her  multiple times not to squeeze it with her thumb yet but this is only for her fingers as they are stiff and weak from prolonged and prior immobilization.  She performs this back well today with no pain and states feeling better at the end of the session.   Exercises- (New bolded)  - Turn [employer] Facing Up & Down  - 4-6 x daily - 10-15 reps - Bend and Pull Back Wrist SLOWLY  - 4 x daily - 10-15 reps - Tendon Glides  - 4-6 x daily - 3-5 reps - 2-3 seconds hold - Thumb AROM IP Blocking  - 4-6 x daily - 10-15 reps - Seated Composite Thumb Flexion AROM  - 4-6 x daily - 1  sets - 10-15 reps - Thumb Opposition  - 4-6 x daily - 10 reps - Forearm Supination Stretch  - 3-4 x daily - 3-5 reps - 15 sec hold - Wrist Prayer Stretch  - 4 x daily - 3-5 reps - 15 sec hold - Wrist Flexion Stretch  - 4 x daily - 3-5 reps - 15 sec hold - Seated Finger Composite Flexion Stretch  - 4 x daily - 3-5 reps - 15 hold - Thumb stretch  - 4 x daily - 3-5 reps - 15-20 sec hold     05/21/23: She performs range of motion for a check and it is only up very slightly at the wrist and not up significantly in the finger, likely due to not removing the orthosis to do exercises as asked.  Today she is not bleeding at her scar line, but there is some small areas of crusted dried blood.  Her skin is dry and flaking.  OT reminds her to keep her hand moisturized, and does this for her today.  OT then encourages her that even if she has a small bleeding area she should put on a Band-Aid and continue with home exercise program.  She is very nervous and guarded, flinching and grimacing throughout the session.  Admittedly though she has no increase in pain and this seems more like anxiety.  She reviews her daily routines with OT via the QuickDASH, and she also performs fine motor skill nine-hole peg test activity which takes her quite a long time due to fear, guarding, stiffness, pain.  OT modified her orthosis and she stated it feels much better  after that and no areas of pain or pressure.  OT also reviews her home exercise program with her and adjusts it to include light PROM of the thumb to be done passively by herself before she attempts to move.  OT does this with her manually today and she seems to improve her flexion and extension of her thumb afterwards.  She also reviews joint blocking range of motion to the IP joint which seems to improve her motion there as well after repeated attempts.  It seems that she just needs to practice more and keep moving to improve her ability and decrease her anxiety.  She has no pain at the end of the session as she leaves  Exercises - Reach arms upward   - 4-6 x daily - 10 reps - Turn [employer] Facing Up & Down  - 4-6 x daily - 10-15 reps - Bend and Pull Back Wrist SLOWLY  - 4 x daily - 10-15 reps - Tendon Glides  - 4-6 x daily - 3-5 reps - 2-3 seconds hold - Thumb AROM IP Blocking  - 4-6 x daily - 10-15 reps - Seated Composite Thumb Flexion AROM  - 4-6 x daily - 1 sets - 10-15 reps - Thumb Opposition  - 4-6 x daily - 10 reps     PATIENT EDUCATION: Education details: See tx section above for details  Person educated: Patient Education method: Engineer, structural, Teach back, Handouts  Education comprehension: States and demonstrates understanding, Additional Education required    HOME EXERCISE PROGRAM: Access Code: [email or web address] URL: [email or web address] Date: 05/19/2023 Prepared by: Fannie Knee   GOALS: Goals reviewed with patient? Yes   SHORT TERM GOALS: (STG required if POC>30 days) Target Date: 06/06/23  Pt will obtain protective, custom orthotic. Goal status: MET   2.  Pt will demo/state understanding of initial HEP to improve pain  levels and prerequisite motion. Goal status: INITIAL   LONG TERM GOALS: Target Date: 07/04/23  Pt will improve functional ability by decreased impairment per Quick DASH assessment from 54% to <20% or better, for better quality of  life. Goal status: INITIAL  2.  Pt will improve grip strength in right hand to at least 35 lbs for functional use at home and in IADLs. Goal status: INITIAL  3.  Pt will improve A/ROM in right thumb MCP and IP joints flexion from 24* / 16* to at least 45*  / 50* respectively, to have functional motion for tasks like reach and grasp.  Goal status: INITIAL  4.  Pt will improve strength in right thumb from painful 3 -/5 MMT to at least 4+/5 MMT to have increased functional ability to carry out selfcare and higher-level homecare tasks with less difficulty. Goal status: INITIAL  5.  Pt will improve coordination skills in right hand, as seen by [facility] score on nine-hole peg test testing to have increased functional ability to carry out fine motor tasks (fasteners, etc.) and more complex, coordinated IADLs (meal prep, sports, etc.).  Goal status: INITIAL  6.  Pt will decrease pain at worst from 8/10 to 3/10 or better to have better sleep and occupational participation in daily roles. Goal status: INITIAL   ASSESSMENT:  CLINICAL IMPRESSION: 05/26/23: She is finally doing well now on the third visit and making excellent gains in terms of motion and pain.  Now her major issue is stiffness for which we started some functional activities as well as stretches.  She tolerated these well with no pain or soreness and we will continue to progress gently as tolerated towards strengthening at 8 weeks  05/21/23: She remains very fearful and guarded as well as anxious with stiffness to the thumb and wrist.  She does better throughout the course of the session showing better ability to touch to the middle finger now and bend and extend her thumb but this needs to be consistent habit for her at home to exercise.  She was cautioned that if she is not moving at least 4-5 times a day as asked, she will get very stiff and continue to have more and more pain.    PLAN:  OT FREQUENCY: 1-2x/week  OT DURATION: 6 weeks  through 07/04/23 and up to 10 visits   PLANNED INTERVENTIONS: self care/ADL training, therapeutic exercise, therapeutic activity, neuromuscular re-education, manual therapy, scar mobilization, passive range of motion, splinting, electrical stimulation, ultrasound, fluidotherapy, compression bandaging, moist heat, cryotherapy, contrast bath, patient/family education, coping strategies training, DME and/or AE instructions, Re-evaluation, and Dry needling  RECOMMENDED OTHER SERVICES: none now   CONSULTED AND AGREED WITH PLAN OF CARE: Patient  PLAN FOR NEXT SESSION:   Review new stretches for the forearm wrist and thumb, no strengthening to the thumb for 8 weeks as ordered.   Fannie Knee, OTR/L, CHT 05/26/2023, 5:20 PM

## 2023-05-26 ENCOUNTER — Ambulatory Visit (INDEPENDENT_AMBULATORY_CARE_PROVIDER_SITE_OTHER): Payer: BC Managed Care – PPO | Admitting: Rehabilitative and Restorative Service Providers"

## 2023-05-26 ENCOUNTER — Encounter: Payer: Self-pay | Admitting: Rehabilitative and Restorative Service Providers"

## 2023-05-26 DIAGNOSIS — M25641 Stiffness of right hand, not elsewhere classified: Secondary | ICD-10-CM | POA: Diagnosis not present

## 2023-05-26 DIAGNOSIS — M6281 Muscle weakness (generalized): Secondary | ICD-10-CM

## 2023-05-26 DIAGNOSIS — M79641 Pain in right hand: Secondary | ICD-10-CM

## 2023-05-26 DIAGNOSIS — R600 Localized edema: Secondary | ICD-10-CM | POA: Diagnosis not present

## 2023-05-26 DIAGNOSIS — R278 Other lack of coordination: Secondary | ICD-10-CM

## 2023-05-29 ENCOUNTER — Encounter: Payer: Self-pay | Admitting: Rehabilitative and Restorative Service Providers"

## 2023-05-29 ENCOUNTER — Ambulatory Visit: Payer: BC Managed Care – PPO | Admitting: Rehabilitative and Restorative Service Providers"

## 2023-05-29 DIAGNOSIS — R600 Localized edema: Secondary | ICD-10-CM

## 2023-05-29 DIAGNOSIS — R278 Other lack of coordination: Secondary | ICD-10-CM

## 2023-05-29 DIAGNOSIS — M79641 Pain in right hand: Secondary | ICD-10-CM | POA: Diagnosis not present

## 2023-05-29 DIAGNOSIS — M6281 Muscle weakness (generalized): Secondary | ICD-10-CM

## 2023-05-29 DIAGNOSIS — M25641 Stiffness of right hand, not elsewhere classified: Secondary | ICD-10-CM

## 2023-05-29 NOTE — Therapy (Signed)
OUTPATIENT OCCUPATIONAL THERAPY TREATMENT NOTE   Patient Name: Tara Rios MRN: [id number] DOB:Dec 15, 1970, 52 y.o., female Today's Date: 05/29/2023  PCP: Mirna Mires, MD REFERRING PROVIDER:  Samuella Cota, MD    END OF SESSION:  OT End of Session - 05/29/23 1554     Visit Number 4    Number of Visits 10    Date for OT Re-Evaluation 07/04/23    Authorization Type BC[employer]    OT Start Time 1558    OT Stop Time 1634    OT Time Calculation (min) 36 min    Activity Tolerance Patient tolerated treatment well;No increased pain;Patient limited by fatigue;Patient limited by pain    Behavior During Therapy [facility] for tasks assessed/performed                Past Medical History:  Diagnosis Date   Hypertension    Past Surgical History:  Procedure Laterality Date   TONSILLECTOMY     ULNAR COLLATERAL LIGAMENT REPAIR Right 04/22/2023   Procedure: RIGHT THUMB ULNAR COLLATERAL LIGAMENT RECONSTRUCTION;  Surgeon: Samuella Cota, MD;  Location: [facility];  Service: Orthopedics;  Laterality: Right;  regional block   Patient Active Problem List   Diagnosis Date Noted   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 joint of thumb 04/22/2023   Abnormal Pap smear of cervix 10/21/2017   Trichomonas infection 05/15/2017   Hypertension    BURSITIS, OLECRANON 03/13/2009    ONSET DATE: DOS 04/22/23  REFERRING DIAG: W09.811B (ICD-10-CM) - 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 joint of thumb   THERAPY DIAG:  Localized edema  Pain in right hand  Stiffness of right hand, not elsewhere classified  Other lack of coordination  Muscle weakness (generalized)  Rationale for Evaluation and Treatment: Rehabilitation  PERTINENT HISTORY: Per MD notes: "Splinting appointment and initiate hand therapy; S/P UCL repair"  She states falling and thinking she broke her thumb, but eventually got MRI showing UCL tear. She had repair and has had  fairly high ain reports and guarding from pain since.  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   PRECAUTIONS: None  RED FLAGS: None   WEIGHT BEARING RESTRICTIONS: Yes no lifting until week 8 post-op     SUBJECTIVE:   SUBJECTIVE STATEMENT: She is 5 weeks post Rt thumb UCL repair.  She states recently having an exacerbation of pain and it is located more on the thenar eminence and still sensitive at her scar line.    PAIN:  Are you having pain?  Yes: NPRS scale: 6/10 at rest Pain location: Rt thumb sx area  Pain description: Sharp and aching Aggravating factors: Touching or moving Relieving factors: Rest   PATIENT GOALS: To safely improve the use of her right dominant hand-wrist and thumb  NEXT MD VISIT: 06/13/23    OBJECTIVE: (All objective assessments below are from initial evaluation on: 05/19/23 unless otherwise specified.)   HAND DOMINANCE: Right   ADLs: Overall ADLs: States decreased ability to grab, hold household objects, pain and difficulty to open containers, perform FMS tasks (manipulate fasteners on clothing), mild to moderate bathing problems as well.    FUNCTIONAL OUTCOME MEASURES: 05/21/23: Quick DASH: 54.5% impairment today    UPPER EXTREMITY ROM     Shoulder to Wrist AROM Right eval Rt 05/22/23 Rt 05/26/23 Rt 05/29/23  Forearm supination ~75     Forearm pronation  ~75     Wrist flexion 65 67 77 72  Wrist extension  48 50 52 40  (Blank rows = not tested)   Hand AROM Right eval Rt 05/21/23 Rt 05/26/23 Rt 05/29/23  Full Fist Ability (or Gap to Distal Palmar Crease) 2cm gap to full fist from tips of fingers to [facility]     Thumb Opposition  (Kapandji Scale)  1/10 1 /10  6 /10   Thumb MCP (0-60) 0- 24 0 - 26 0 - 41 0 - 38  Thumb IP (0-80) 0- 16  0 - 9 0 - 21 0 - 23  Thumb Radial Abduction Span      Thumb Palmar Abduction Span      (Blank rows = not tested)   UPPER EXTREMITY MMT:    Eval:  NT at eval due to recent and still  healing injuries. Will be tested when appropriate.   MMT Right TBD  Elbow flexion   Elbow extension   Forearm supination   Forearm pronation   Wrist flexion   Wrist extension   Wrist ulnar deviation   Wrist radial deviation   (Blank rows = not tested)  HAND FUNCTION: Eval: Observed weakness in affected Rt hand.  Details will be tested when safe Grip strength Right: TBD lbs, Left: TBD lbs   COORDINATION: 05/21/23: 9 Hole Peg Test Right: 1 min 36sec, (approx 22 sec is WFL)   Eval: Observed coordination impairments with affected Rt hand.  Details will be tested when time allows   SENSATION: Eval:  Light touch intact today distally, though diminished around sx area .  She also shows some significant hypersensitivity/allodynia around the scar area  EDEMA:   Eval:  Mildly swollen in Rt hand and thumb today  OBSERVATIONS:   Eval: she is overtly hypersensitive and guarded today. She can barely tolerate a light touch, she can also barely move at the [facility] or MCP joints of the right thumb.  IP joint is also extremely stiff (S/P UCL repair)    TODAY'S TREATMENT:  05/29/23: As she is having a bit of a pain exacerbation, OT focuses the session on coping and pain relief.  She starts with moist heat for 3 minutes while OT discusses her functional activities and self-care reminding her not to do any gripping pushing or pulling or repetitive activity.  OT advises to do less weaning from her orthosis now that she is feeling more pain.  Next, OT uses a vibration tool to help desensitize around her scar, but she is very sensitive around the dorsal aspect of the scar still.  She flinches and jumps several times during this treatment, so OT downgrades the desensitization activity to a light touch with a towel.  She can barely tolerate this today and OT is suspicious that she has not been doing this 4 times a day as asked at home.  So OT reiterates that she should be touching with progressively rougher  materials at least 4 times a day for 4 minutes and she states understanding.  Next, OT does manual therapy IASTM along the dorsal aspect of the thumb, the radial aspect of the wrist and through the thenar eminence.  She has some rigidity and tightness in the muscles around the thenar eminence and likely has not been extending her thumb actively during active range of motion exercises.  After IASTM manual therapy is done, OT has her do her wrist stretches more intensely which do not affect her thumb when she is relaxed.  OT also has a repeat her gentle thumb composite flexion stretch finding and  nonpainful stretch, also introducing gentle thumb extension stretches with caution to not put tension on the UCL.  Lastly, OT has her perform active range of motion to tension points at the thumb emphasizing active extension to stretch the thenar eminence.  She does this without any significant pain, then rests on a cool pack for 2 minutes, afterwards she states pain is down to 4 out of 10 or better now showing improvement with therapy and techniques.      05/26/23: She performs active range of motion for new measures which show excellent improvement at the thumb and wrist flexion but not in wrist extension.  As she feels more stiff than painful, OT upgrades her home exercises to include some new wrist stretches in flexion and especially extension due to the tightness there as well as a forearm supination stretch and gentle composite thumb stretches as listed below.  She can also start to perform circumduction putting a light stress with range of motion into palmar abduction now.  Additionally, we reviewed ways she can stretch her fingers that she states they are stiff from even prior immobilization.  OT also supplies her with green therapy putty for working her fingers only.  It was stressed to her multiple times not to squeeze it with her thumb yet but this is only for her fingers as they are stiff and weak from  prolonged and prior immobilization.  She performs this back well today with no pain and states feeling better at the end of the session.   Exercises- (New bolded)  - Turn [employer] Facing Up & Down  - 4-6 x daily - 10-15 reps - Bend and Pull Back Wrist SLOWLY  - 4 x daily - 10-15 reps - Tendon Glides  - 4-6 x daily - 3-5 reps - 2-3 seconds hold - Thumb AROM IP Blocking  - 4-6 x daily - 10-15 reps - Seated Composite Thumb Flexion AROM  - 4-6 x daily - 1 sets - 10-15 reps - Thumb Opposition  - 4-6 x daily - 10 reps - Forearm Supination Stretch  - 3-4 x daily - 3-5 reps - 15 sec hold - Wrist Prayer Stretch  - 4 x daily - 3-5 reps - 15 sec hold - Wrist Flexion Stretch  - 4 x daily - 3-5 reps - 15 sec hold - Seated Finger Composite Flexion Stretch  - 4 x daily - 3-5 reps - 15 hold - Thumb stretch  - 4 x daily - 3-5 reps - 15-20 sec hold     05/21/23: She performs range of motion for a check and it is only up very slightly at the wrist and not up significantly in the finger, likely due to not removing the orthosis to do exercises as asked.  Today she is not bleeding at her scar line, but there is some small areas of crusted dried blood.  Her skin is dry and flaking.  OT reminds her to keep her hand moisturized, and does this for her today.  OT then encourages her that even if she has a small bleeding area she should put on a Band-Aid and continue with home exercise program.  She is very nervous and guarded, flinching and grimacing throughout the session.  Admittedly though she has no increase in pain and this seems more like anxiety.  She reviews her daily routines with OT via the QuickDASH, and she also performs fine motor skill nine-hole peg test activity which takes her quite a long time  due to fear, guarding, stiffness, pain.  OT modified her orthosis and she stated it feels much better after that and no areas of pain or pressure.  OT also reviews her home exercise program with her and adjusts it to  include light PROM of the thumb to be done passively by herself before she attempts to move.  OT does this with her manually today and she seems to improve her flexion and extension of her thumb afterwards.  She also reviews joint blocking range of motion to the IP joint which seems to improve her motion there as well after repeated attempts.  It seems that she just needs to practice more and keep moving to improve her ability and decrease her anxiety.  She has no pain at the end of the session as she leaves  Exercises - Reach arms upward   - 4-6 x daily - 10 reps - Turn [employer] Facing Up & Down  - 4-6 x daily - 10-15 reps - Bend and Pull Back Wrist SLOWLY  - 4 x daily - 10-15 reps - Tendon Glides  - 4-6 x daily - 3-5 reps - 2-3 seconds hold - Thumb AROM IP Blocking  - 4-6 x daily - 10-15 reps - Seated Composite Thumb Flexion AROM  - 4-6 x daily - 1 sets - 10-15 reps - Thumb Opposition  - 4-6 x daily - 10 reps     PATIENT EDUCATION: Education details: See tx section above for details  Person educated: Patient Education method: Engineer, structural, Teach back, Handouts  Education comprehension: States and demonstrates understanding, Additional Education required    HOME EXERCISE PROGRAM: Access Code: [email or web address] URL: [email or web address] Date: 05/19/2023 Prepared by: Fannie Knee   GOALS: Goals reviewed with patient? Yes   SHORT TERM GOALS: (STG required if POC>30 days) Target Date: 06/06/23  Pt will obtain protective, custom orthotic. Goal status: MET   2.  Pt will demo/state understanding of initial HEP to improve pain levels and prerequisite motion. Goal status: INITIAL   LONG TERM GOALS: Target Date: 07/04/23  Pt will improve functional ability by decreased impairment per Quick DASH assessment from 54% to <20% or better, for better quality of life. Goal status: INITIAL  2.  Pt will improve grip strength in right hand to at least 35 lbs for functional use  at home and in IADLs. Goal status: INITIAL  3.  Pt will improve A/ROM in right thumb MCP and IP joints flexion from 24* / 16* to at least 45*  / 50* respectively, to have functional motion for tasks like reach and grasp.  Goal status: INITIAL  4.  Pt will improve strength in right thumb from painful 3 -/5 MMT to at least 4+/5 MMT to have increased functional ability to carry out selfcare and higher-level homecare tasks with less difficulty. Goal status: INITIAL  5.  Pt will improve coordination skills in right hand, as seen by [facility] score on nine-hole peg test testing to have increased functional ability to carry out fine motor tasks (fasteners, etc.) and more complex, coordinated IADLs (meal prep, sports, etc.).  Goal status: INITIAL  6.  Pt will decrease pain at worst from 8/10 to 3/10 or better to have better sleep and occupational participation in daily roles. Goal status: INITIAL   ASSESSMENT:  CLINICAL IMPRESSION: 05/29/23: She had a bit of an exacerbation of pain recently, slowly downgraded her expectations somewhat and adjusted her home exercise program to focus more on desensitization and active thumb extension  to prevent pain around the scar and the thenar eminence.  She tolerated this well with a decrease in pain.  Carry on    05/26/23: She is finally doing well now on the third visit and making excellent gains in terms of motion and pain.  Now her major issue is stiffness for which we started some functional activities as well as stretches.  She tolerated these well with no pain or soreness and we will continue to progress gently as tolerated towards strengthening at 8 weeks  05/21/23: She remains very fearful and guarded as well as anxious with stiffness to the thumb and wrist.  She does better throughout the course of the session showing better ability to touch to the middle finger now and bend and extend her thumb but this needs to be consistent habit for her at home to exercise.   She was cautioned that if she is not moving at least 4-5 times a day as asked, she will get very stiff and continue to have more and more pain.    PLAN:  OT FREQUENCY: 1-2x/week  OT DURATION: 6 weeks through 07/04/23 and up to 10 visits   PLANNED INTERVENTIONS: self care/ADL training, therapeutic exercise, therapeutic activity, neuromuscular re-education, manual therapy, scar mobilization, passive range of motion, splinting, electrical stimulation, ultrasound, fluidotherapy, compression bandaging, moist heat, cryotherapy, contrast bath, patient/family education, coping strategies training, DME and/or AE instructions, Re-evaluation, and Dry needling  RECOMMENDED OTHER SERVICES: none now   CONSULTED AND AGREED WITH PLAN OF CARE: Patient  PLAN FOR NEXT SESSION:   Check to see if pain exacerbation is better, if so gently get back into more functional activities stronger stretches, etc.  If stiffness remains get into dorsal taping techniques  Fannie Knee, OTR/L, CHT 05/29/2023, 4:42 PM

## 2023-06-02 ENCOUNTER — Ambulatory Visit (INDEPENDENT_AMBULATORY_CARE_PROVIDER_SITE_OTHER): Payer: BC Managed Care – PPO | Admitting: Rehabilitative and Restorative Service Providers"

## 2023-06-02 ENCOUNTER — Encounter: Payer: Self-pay | Admitting: Rehabilitative and Restorative Service Providers"

## 2023-06-02 DIAGNOSIS — M25641 Stiffness of right hand, not elsewhere classified: Secondary | ICD-10-CM

## 2023-06-02 DIAGNOSIS — R600 Localized edema: Secondary | ICD-10-CM | POA: Diagnosis not present

## 2023-06-02 DIAGNOSIS — R278 Other lack of coordination: Secondary | ICD-10-CM

## 2023-06-02 DIAGNOSIS — M79641 Pain in right hand: Secondary | ICD-10-CM

## 2023-06-02 DIAGNOSIS — M6281 Muscle weakness (generalized): Secondary | ICD-10-CM

## 2023-06-02 NOTE — Therapy (Signed)
OUTPATIENT OCCUPATIONAL THERAPY TREATMENT NOTE   Patient Name: Tara Rios MRN: [id number] DOB:11-Jan-1971, 52 y.o., female Today's Date: 06/02/2023  PCP: Mirna Mires, MD REFERRING PROVIDER:  Samuella Cota, MD    END OF SESSION:  OT End of Session - 06/02/23 1528     Visit Number 5    Number of Visits 10    Date for OT Re-Evaluation 07/04/23    Authorization Type BCBS    OT Start Time 1528    OT Stop Time 1611    OT Time Calculation (min) 43 min    Equipment Utilized During Treatment transpore tape    Activity Tolerance Patient tolerated treatment well;Patient limited by fatigue;Patient limited by pain    Behavior During Therapy WFL for tasks assessed/performed              Past Medical History:  Diagnosis Date   Hypertension    Past Surgical History:  Procedure Laterality Date   TONSILLECTOMY     ULNAR COLLATERAL LIGAMENT REPAIR Right 04/22/2023   Procedure: RIGHT THUMB ULNAR COLLATERAL LIGAMENT RECONSTRUCTION;  Surgeon: Samuella Cota, MD;  Location: [facility];  Service: Orthopedics;  Laterality: Right;  regional block   Patient Active Problem List   Diagnosis Date Noted   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 joint of thumb 04/22/2023   Abnormal Pap smear of cervix 10/21/2017   Trichomonas infection 05/15/2017   Hypertension    BURSITIS, OLECRANON 03/13/2009    ONSET DATE: DOS 04/22/23  REFERRING DIAG: W09.811B (ICD-10-CM) - 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 joint of thumb   THERAPY DIAG:  Localized edema  Pain in right hand  Stiffness of right hand, not elsewhere classified  Other lack of coordination  Muscle weakness (generalized)  Rationale for Evaluation and Treatment: Rehabilitation  PERTINENT HISTORY: Per MD notes: "Splinting appointment and initiate hand therapy; S/P UCL repair"  She states falling and thinking she broke her thumb, but eventually got MRI showing UCL  tear. She had repair and has had fairly high ain reports and guarding from pain since.  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   PRECAUTIONS: None  RED FLAGS: None   WEIGHT BEARING RESTRICTIONS: Yes no lifting until week 8 post-op     SUBJECTIVE:   SUBJECTIVE STATEMENT: She is 6 weeks post Rt thumb UCL repair.  She states pain exacerbation is better and lower now, she rested more.     PAIN:  Are you having pain?  Yes: NPRS scale: 2/10 at rest Pain location: Rt thumb sx area  Pain description: Sharp and aching Aggravating factors: Touching or moving Relieving factors: Rest   PATIENT GOALS: To safely improve the use of her right dominant hand-wrist and thumb  NEXT MD VISIT: 06/13/23    OBJECTIVE: (All objective assessments below are from initial evaluation on: 05/19/23 unless otherwise specified.)   HAND DOMINANCE: Right   ADLs: Overall ADLs: States decreased ability to grab, hold household objects, pain and difficulty to open containers, perform FMS tasks (manipulate fasteners on clothing), mild to moderate bathing problems as well.    FUNCTIONAL OUTCOME MEASURES: 05/21/23: Quick DASH: 54.5% impairment today    UPPER EXTREMITY ROM     Shoulder to Wrist AROM Right eval Rt 05/22/23 Rt 05/26/23 Rt 05/29/23 Rt 06/02/23  Forearm supination ~75      Forearm pronation  ~75      Wrist flexion 65 67 77 72 65  Wrist extension 48  50 52 40 54  (Blank rows = not tested)   Hand AROM Right eval Rt 05/21/23 Rt 05/26/23 Rt 05/29/23 Rt 06/02/23  Full Fist Ability (or Gap to Distal Palmar Crease) 2cm gap to full fist from tips of fingers to [facility]      Thumb Opposition  (Kapandji Scale)  1/10 1 /10  6 /10    Thumb MCP (0-60) 0- 24 0 - 26 0 - 41 0 - 38 0- 35  Thumb IP (0-80) 0- 16  0 - 9 0 - 21 0 - 23 0- 22  Thumb Radial Abduction Span       Thumb Palmar Abduction Span       (Blank rows = not tested)   UPPER EXTREMITY MMT:    Eval:  NT at eval due to  recent and still healing injuries. Will be tested when appropriate.   MMT Right TBD  Elbow flexion   Elbow extension   Forearm supination   Forearm pronation   Wrist flexion   Wrist extension   Wrist ulnar deviation   Wrist radial deviation   (Blank rows = not tested)  HAND FUNCTION: Eval: Observed weakness in affected Rt hand.  Details will be tested when safe Grip strength Right: TBD lbs, Left: TBD lbs   COORDINATION: 05/21/23: 9 Hole Peg Test Right: 1 min 36sec, (approx 22 sec is WFL)   Eval: Observed coordination impairments with affected Rt hand.  Details will be tested when time allows   SENSATION: Eval:  Light touch intact today distally, though diminished around sx area .  She also shows some significant hypersensitivity/allodynia around the scar area  EDEMA:   Eval:  Mildly swollen in Rt hand and thumb today  OBSERVATIONS:   Eval: she is overtly hypersensitive and guarded today. She can barely tolerate a light touch, she can also barely move at the [facility] or MCP joints of the right thumb.  IP joint is also extremely stiff (S/P UCL repair)    TODAY'S TREATMENT:  06/02/23: She performs active range of motion which shows slight improvement in wrist motion today but no significant movement at the thumb.  She still has some hypersensitivity to her surgical line and is encouraged to keep doing desensitization often.  OT modifies her orthosis and fits more tightly to be worn without any under wrapping or gauze now.  She states it fits better and more snugly.  OT then uses moist heat for 3 minutes while reviewing her HEP, then does manual therapy IASTM with a small tool for sweeping of the fascia and slight scooping in the thumb webspace-staying away from the sensitive scar as she does not tolerate being touched or with this metal tool.  OT then does manual therapy stretches at the MCP joint and at the IP joint gently into flexion separately and in composite.  She reviews thumb  opposition to the small finger which she can do easily now and is encouraged to "slide down the small finger."  Additionally for stubborn stiffness at the thumb that may be capsular in nature now, she is educated on dorsal taping that she can either do in her orthosis targeting the IP joint only or outside of her orthosis targeting the full thumb and composite flexion.  OT does these both with her today with education and demonstration and she tolerates both well with a light nonpainful stretch.  She was educated to add these to her home activities and exercises 2 or 3 times in  the day as possible for a light stretch, for 15 to 20 minutes.  For functional activities we also discussed starting to wean from her orthosis in 15-minute increments in the day with the goal of complete weaning during the day at some point next week.  She can also be picking up small things, spending a pen between her fingers for fine motor skills, eating her meals and small finger foods without a brace on now.  This will hopefully boost her confidence and motion and decrease her pain and guarding.  She states understanding, and leaves feeling better and in no pain.    PATIENT EDUCATION: Education details: See tx section above for details  Person educated: Patient Education method: Verbal Instruction, Teach back, Handouts  Education comprehension: States and demonstrates understanding, Additional Education required    HOME EXERCISE PROGRAM: Access Code: [email or web address] URL: [email or web address] Date: 05/19/2023 Prepared by: Fannie Knee   GOALS: Goals reviewed with patient? Yes   SHORT TERM GOALS: (STG required if POC>30 days) Target Date: 06/06/23  Pt will obtain protective, custom orthotic. Goal status: MET   2.  Pt will demo/state understanding of initial HEP to improve pain levels and prerequisite motion. Goal status: INITIAL   LONG TERM GOALS: Target Date: 07/04/23  Pt will improve functional  ability by decreased impairment per Quick DASH assessment from 54% to <20% or better, for better quality of life. Goal status: INITIAL  2.  Pt will improve grip strength in right hand to at least 35 lbs for functional use at home and in IADLs. Goal status: INITIAL  3.  Pt will improve A/ROM in right thumb MCP and IP joints flexion from 24* / 16* to at least 45*  / 50* respectively, to have functional motion for tasks like reach and grasp.  Goal status: INITIAL  4.  Pt will improve strength in right thumb from painful 3 -/5 MMT to at least 4+/5 MMT to have increased functional ability to carry out selfcare and higher-level homecare tasks with less difficulty. Goal status: INITIAL  5.  Pt will improve coordination skills in right hand, as seen by [facility] score on nine-hole peg test testing to have increased functional ability to carry out fine motor tasks (fasteners, etc.) and more complex, coordinated IADLs (meal prep, sports, etc.).  Goal status: INITIAL  6.  Pt will decrease pain at worst from 8/10 to 3/10 or better to have better sleep and occupational participation in daily roles. Goal status: INITIAL   ASSESSMENT:  CLINICAL IMPRESSION: 06/02/23: She continues to be very nervous and stiff, anxious, hypersensitive around her scar.  Pain levels are decreasing somewhat fortunately.  OT is encouraging more functional activities and weaning from the orthosis to help change some of these perceptions of pain and debility.  05/29/23: She had a bit of an exacerbation of pain recently, slowly downgraded her expectations somewhat and adjusted her home exercise program to focus more on desensitization and active thumb extension to prevent pain around the scar and the thenar eminence.  She tolerated this well with a decrease in pain.  Carry on    05/26/23: She is finally doing well now on the third visit and making excellent gains in terms of motion and pain.  Now her major issue is stiffness for which we  started some functional activities as well as stretches.  She tolerated these well with no pain or soreness and we will continue to progress gently as tolerated towards strengthening at 8 weeks  05/21/23: She  remains very fearful and guarded as well as anxious with stiffness to the thumb and wrist.  She does better throughout the course of the session showing better ability to touch to the middle finger now and bend and extend her thumb but this needs to be consistent habit for her at home to exercise.  She was cautioned that if she is not moving at least 4-5 times a day as asked, she will get very stiff and continue to have more and more pain.    PLAN:  OT FREQUENCY: 1-2x/week  OT DURATION: 6 weeks through 07/04/23 and up to 10 visits   PLANNED INTERVENTIONS: self care/ADL training, therapeutic exercise, therapeutic activity, neuromuscular re-education, manual therapy, scar mobilization, passive range of motion, splinting, electrical stimulation, ultrasound, fluidotherapy, compression bandaging, moist heat, cryotherapy, contrast bath, patient/family education, coping strategies training, DME and/or AE instructions, Re-evaluation, and Dry needling  RECOMMENDED OTHER SERVICES: none now   CONSULTED AND AGREED WITH PLAN OF CARE: Patient  PLAN FOR NEXT SESSION:   She should wean from the orthosis in the day now and completely weaned by 8 weeks postop.  We need to keep the dressing hypersensitivity and stiffness, check on dorsal taping technique.  Fannie Knee, OTR/L, CHT 06/02/2023, 5:04 PM

## 2023-06-05 ENCOUNTER — Ambulatory Visit: Payer: BC Managed Care – PPO | Admitting: Rehabilitative and Restorative Service Providers"

## 2023-06-05 ENCOUNTER — Encounter: Payer: Self-pay | Admitting: Rehabilitative and Restorative Service Providers"

## 2023-06-05 DIAGNOSIS — M79641 Pain in right hand: Secondary | ICD-10-CM | POA: Diagnosis not present

## 2023-06-05 DIAGNOSIS — R600 Localized edema: Secondary | ICD-10-CM

## 2023-06-05 DIAGNOSIS — M25641 Stiffness of right hand, not elsewhere classified: Secondary | ICD-10-CM

## 2023-06-05 DIAGNOSIS — M6281 Muscle weakness (generalized): Secondary | ICD-10-CM

## 2023-06-05 DIAGNOSIS — R278 Other lack of coordination: Secondary | ICD-10-CM

## 2023-06-05 NOTE — Therapy (Signed)
OUTPATIENT OCCUPATIONAL THERAPY TREATMENT NOTE   Patient Name: Tara Rios MRN: [id number] DOB:19-Dec-1970, 52 y.o., female Today's Date: 06/05/2023  PCP: Mirna Mires, MD REFERRING PROVIDER:  Samuella Cota, MD    END OF SESSION:  OT End of Session - 06/05/23 0923     Visit Number 6    Number of Visits 10    Date for OT Re-Evaluation 07/04/23    Authorization Type BCBS    OT Start Time [phone]    OT Stop Time 1013    OT Time Calculation (min) 50 min    Activity Tolerance Patient tolerated treatment well;Patient limited by fatigue;Patient limited by pain;No increased pain    Behavior During Therapy WFL for tasks assessed/performed               Past Medical History:  Diagnosis Date   Hypertension    Past Surgical History:  Procedure Laterality Date   TONSILLECTOMY     ULNAR COLLATERAL LIGAMENT REPAIR Right 04/22/2023   Procedure: RIGHT THUMB ULNAR COLLATERAL LIGAMENT RECONSTRUCTION;  Surgeon: Samuella Cota, MD;  Location: [facility];  Service: Orthopedics;  Laterality: Right;  regional block   Patient Active Problem List   Diagnosis Date Noted   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 joint of thumb 04/22/2023   Abnormal Pap smear of cervix 10/21/2017   Trichomonas infection 05/15/2017   Hypertension    BURSITIS, OLECRANON 03/13/2009    ONSET DATE: DOS 04/22/23  REFERRING DIAG: E95.284X (ICD-10-CM) - 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 joint of thumb   THERAPY DIAG:  Localized edema  Stiffness of right hand, not elsewhere classified  Pain in right hand  Muscle weakness (generalized)  Other lack of coordination  Rationale for Evaluation and Treatment: Rehabilitation  PERTINENT HISTORY: Per MD notes: "Splinting appointment and initiate hand therapy; S/P UCL repair"  She states falling and thinking she broke her thumb, but eventually got MRI showing UCL tear. She had repair and has had  fairly high ain reports and guarding from pain since.  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   PRECAUTIONS: None  RED FLAGS: None   WEIGHT BEARING RESTRICTIONS: Yes no lifting until week 8 post-op     SUBJECTIVE:   SUBJECTIVE STATEMENT: She is 6+ weeks post Rt thumb UCL repair.  She states tolerating stretches and dorsal taping with no pain. She had mild swelling last night and the newly sized brace didn't fit for a while. It's fitting well now     PAIN:  Are you having pain?  None now at rest    PATIENT GOALS: To safely improve the use of her right dominant hand-wrist and thumb  NEXT MD VISIT: 06/13/23    OBJECTIVE: (All objective assessments below are from initial evaluation on: 05/19/23 unless otherwise specified.)   HAND DOMINANCE: Right   ADLs: Overall ADLs: States decreased ability to grab, hold household objects, pain and difficulty to open containers, perform FMS tasks (manipulate fasteners on clothing), mild to moderate bathing problems as well.    FUNCTIONAL OUTCOME MEASURES: 05/21/23: Quick DASH: 54.5% impairment today    UPPER EXTREMITY ROM     Shoulder to Wrist AROM Right eval Rt 05/22/23 Rt 05/26/23 Rt 05/29/23 Rt 06/02/23 Rt 06/05/23  Forearm supination ~75       Forearm pronation  ~75       Wrist flexion 65 67 77 72 65 77  Wrist extension 48 50 52 40 54  44  (Blank rows = not tested)   Hand AROM Right eval Rt 05/21/23 Rt 05/26/23 Rt 05/29/23 Rt 06/02/23 Rt 06/05/23  Full Fist Ability (or Gap to Distal Palmar Crease) 2cm gap to full fist from tips of fingers to [facility]       Thumb Opposition  (Kapandji Scale)  1/10 1 /10  6 /10     Thumb MCP (0-60) 0- 24 0 - 26 0 - 41 0 - 38 0- 35 0 - 50  Thumb IP (0-80) 0- 16  0 - 9 0 - 21 0 - 23 0- 22 0 - 25  Thumb Radial Abduction Span        Thumb Palmar Abduction Span        (Blank rows = not tested)   UPPER EXTREMITY MMT:    Eval:  NT at eval due to recent and still healing  injuries. Will be tested when appropriate.   MMT Right TBD  Elbow flexion   Elbow extension   Forearm supination   Forearm pronation   Wrist flexion   Wrist extension   Wrist ulnar deviation   Wrist radial deviation   (Blank rows = not tested)  HAND FUNCTION: Eval: Observed weakness in affected Rt hand.  Details will be tested when safe Grip strength Right: TBD lbs, Left: TBD lbs   COORDINATION: 06/05/23: 9HPT Rt: 20.7sec today (WNL)   05/21/23: 9 Hole Peg Test Right: 1 min 36sec, (approx 22 sec is WFL)   Eval: Observed coordination impairments with affected Rt hand.  Details will be tested when time allows   SENSATION: Eval:  Light touch intact today distally, though diminished around sx area .  She also shows some significant hypersensitivity/allodynia around the scar area  EDEMA:   06/05/23: none visual or significant     OBSERVATIONS:   Eval: she is overtly hypersensitive and guarded today. She can barely tolerate a light touch, she can also barely move at the [facility] or MCP joints of the right thumb.  IP joint is also extremely stiff (S/P UCL repair)    TODAY'S TREATMENT:  06/05/23: She starts with AROM for exercise and new measures that show that her motion is significantly improved after starting light and gentle dorsal taping.  For her hypersensitivity, OT has her perform desensitization and fluidotherapy for 6 minutes today concurrently with reaching and grasp of objects hidden in the medium.  She tolerates this well with some "strange" sensations and no significant pains.  She also performs functional activity fine motor skills nine-hole peg test and now is within normal limits doing much better.  OT does manual therapy IASTM around her tight thumb and thenar eminence, after which she states feeling a bit looser.  OT also does manual stretches with her with a review of the stretches to not be painful and only be very gentle for now.  We discuss fnl activities and weaning  of her orthosis for light and non-painful tasks, and OT does explain that it's not abnormal to have pain for the first 6-8 weeks, and it's ok to need supportive bracing until 10 or 11 weeks post-op, especially if she's having pain.  We review her HEP and light stretches as well as new dorsal taping technique.  Additionally she is educated on in hand manipulation fine motor skills "pen tricks."  She leaves in no significant pain stating understanding to use caution still for the next month and use bracing is much as needed.  In-Hand Manipulation Skills  Rotation:  Hold pen, try to "twirl" like a baton, keeping parallel (or flat) with surface of table. Try going BOTH directions 10x  Flip:  Hold pen in writing position,  flip in an arch to "erase" position, then back to "write" position. Do not lift hand off table.  10x  Translation:  Open hand palm up,  put an object in your palm and then use your fingers and thumb to move it to the tips of your fingers, pinched against your thumb. (bigger is easier (fat marker), smaller is harder (penny)) 10x  Shift:  Hold pen like a dart, start "shifting" it forward & backwards from tip to base (like putting a key in a key hole) 10x     PATIENT EDUCATION: Education details: See tx section above for details  Person educated: Patient Education method: Verbal Instruction, Teach back, Handouts  Education comprehension: States and demonstrates understanding, Additional Education required    HOME EXERCISE PROGRAM: Access Code: [email or web address] URL: [email or web address] Date: 05/19/2023 Prepared by: Fannie Knee   GOALS: Goals reviewed with patient? Yes   SHORT TERM GOALS: (STG required if POC>30 days) Target Date: 06/06/23  Pt will obtain protective, custom orthotic. Goal status: MET   2.  Pt will demo/state understanding of initial HEP to improve pain levels and prerequisite motion. Goal status: INITIAL   LONG TERM GOALS: Target Date:  07/04/23  Pt will improve functional ability by decreased impairment per Quick DASH assessment from 54% to <20% or better, for better quality of life. Goal status: INITIAL  2.  Pt will improve grip strength in right hand to at least 35 lbs for functional use at home and in IADLs. Goal status: INITIAL  3.  Pt will improve A/ROM in right thumb MCP and IP joints flexion from 24* / 16* to at least 45*  / 50* respectively, to have functional motion for tasks like reach and grasp.  Goal status: INITIAL  4.  Pt will improve strength in right thumb from painful 3 -/5 MMT to at least 4+/5 MMT to have increased functional ability to carry out selfcare and higher-level homecare tasks with less difficulty. Goal status: INITIAL  5.  Pt will improve coordination skills in right hand, as seen by [facility] score on nine-hole peg test testing to have increased functional ability to carry out fine motor tasks (fasteners, etc.) and more complex, coordinated IADLs (meal prep, sports, etc.).  Goal status: INITIAL  6.  Pt will decrease pain at worst from 8/10 to 3/10 or better to have better sleep and occupational participation in daily roles. Goal status: INITIAL   ASSESSMENT:  CLINICAL IMPRESSION: 06/05/23: She still has some hypersensitivity around her scar and is still fairly stiff and tender around the UCL.  This is not totally abnormal at this point and she was educated on that and also the need to brace as often as needed to prevent pain and exacerbation of swelling or problems.  She has been improving motion, pain, functional activity and we will carry on.    PLAN:  OT FREQUENCY: 1-2x/week  OT DURATION: 6 weeks through 07/04/23 and up to 10 visits   PLANNED INTERVENTIONS: self care/ADL training, therapeutic exercise, therapeutic activity, neuromuscular re-education, manual therapy, scar mobilization, passive range of motion, splinting, electrical stimulation, ultrasound, fluidotherapy, compression  bandaging, moist heat, cryotherapy, contrast bath, patient/family education, coping strategies training, DME and/or AE instructions, Re-evaluation, and Dry needling  RECOMMENDED OTHER SERVICES: none now   CONSULTED AND AGREED WITH PLAN OF CARE:  Patient  PLAN FOR NEXT SESSION:   Continue to progress with light stretches, gentle range of motion and functional activities next week.  Continue with desensitization for hypersensitive scar and start gentle strengthening at 8 weeks.  Fannie Knee, OTR/L, CHT 06/05/2023, 2:09 PM

## 2023-06-06 NOTE — Therapy (Signed)
OUTPATIENT OCCUPATIONAL THERAPY TREATMENT NOTE   Patient Name: Tara Rios MRN: [id number] DOB:1971-03-26, 52 y.o., female Today's Date: 06/09/2023  PCP: Mirna Mires, MD REFERRING PROVIDER:  Samuella Cota, MD    END OF SESSION:  OT End of Session - 06/09/23 1602     Visit Number 7    Number of Visits 10    Date for OT Re-Evaluation 07/04/23    Authorization Type BCBS    OT Start Time 1602    OT Stop Time 1650    OT Time Calculation (min) 48 min    Equipment Utilized During Treatment orthotic materials    Activity Tolerance Patient tolerated treatment well;Patient limited by fatigue;Patient limited by pain;No increased pain    Behavior During Therapy WFL for tasks assessed/performed              Past Medical History:  Diagnosis Date   Hypertension    Past Surgical History:  Procedure Laterality Date   TONSILLECTOMY     ULNAR COLLATERAL LIGAMENT REPAIR Right 04/22/2023   Procedure: RIGHT THUMB ULNAR COLLATERAL LIGAMENT RECONSTRUCTION;  Surgeon: Samuella Cota, MD;  Location: [facility];  Service: Orthopedics;  Laterality: Right;  regional block   Patient Active Problem List   Diagnosis Date Noted   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 joint of thumb 04/22/2023   Abnormal Pap smear of cervix 10/21/2017   Trichomonas infection 05/15/2017   Hypertension    BURSITIS, OLECRANON 03/13/2009    ONSET DATE: DOS 04/22/23  REFERRING DIAG: N82.956O (ICD-10-CM) - 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 joint of thumb   THERAPY DIAG:  Localized edema  Stiffness of right hand, not elsewhere classified  Pain in right hand  Muscle weakness (generalized)  Other lack of coordination  Rationale for Evaluation and Treatment: Rehabilitation  PERTINENT HISTORY: Per MD notes: "Splinting appointment and initiate hand therapy; S/P UCL repair"  She states falling and thinking she broke her thumb, but  eventually got MRI showing UCL tear. She had repair and has had fairly high ain reports and guarding from pain since.  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   PRECAUTIONS: None  RED FLAGS: None   WEIGHT BEARING RESTRICTIONS: Yes no lifting until week 8 post-op     SUBJECTIVE:   SUBJECTIVE STATEMENT: She is ~7 weeks post Rt thumb UCL repair.  She states not having any significant pain but still mild irritation near the scar and the feeling like she has "jammed" her finger that bothers her.  She still is some tight tissues around the thenar eminence and into the index finger.    PAIN:  Are you having pain?  None now at rest    PATIENT GOALS: To safely improve the use of her right dominant hand-wrist and thumb  NEXT MD VISIT: 06/13/23    OBJECTIVE: (All objective assessments below are from initial evaluation on: 05/19/23 unless otherwise specified.)   HAND DOMINANCE: Right   ADLs: Overall ADLs: States decreased ability to grab, hold household objects, pain and difficulty to open containers, perform FMS tasks (manipulate fasteners on clothing), mild to moderate bathing problems as well.    FUNCTIONAL OUTCOME MEASURES: 05/21/23: Quick DASH: 54.5% impairment today    UPPER EXTREMITY ROM     Shoulder to Wrist AROM Right eval Rt 05/22/23 Rt 05/26/23 Rt 05/29/23 Rt 06/02/23 Rt 06/05/23 Rt 06/09/23  Forearm supination ~75        Forearm pronation  ~75  Wrist flexion 65 67 77 72 65 77 76  Wrist extension 48 50 52 40 54 44 52  (Blank rows = not tested)   Hand AROM Right eval Rt 05/26/23 Rt 05/29/23 Rt 06/02/23 Rt 06/05/23 Rt 06/09/23  Full Fist Ability (or Gap to Distal Palmar Crease) 2cm gap to full fist from tips of fingers to [facility]       Thumb Opposition  (Kapandji Scale)  1/10 6 /10      Thumb MCP (0-60) 0- 24 0 - 41 0 - 38 0- 35 0 - 50 0 - 47  Thumb IP (0-80) 0- 16  0 - 21 0 - 23 0- 22 0 - 25 0 - 24  Thumb Radial Abduction Span         Thumb Palmar Abduction Span        (Blank rows = not tested)   UPPER EXTREMITY MMT:    Eval:  NT at eval due to recent and still healing injuries. Will be tested when appropriate.   MMT Right TBD  Elbow flexion   Elbow extension   Forearm supination   Forearm pronation   Wrist flexion   Wrist extension   Wrist ulnar deviation   Wrist radial deviation   (Blank rows = not tested)  HAND FUNCTION: Eval: Observed weakness in affected Rt hand.  Details will be tested when safe Grip strength Right: TBD lbs, Left: TBD lbs   COORDINATION: 06/05/23: 9HPT Rt: 20.7sec today (WNL)   05/21/23: 9 Hole Peg Test Right: 1 min 36sec, (approx 22 sec is WFL)   Eval: Observed coordination impairments with affected Rt hand.  Details will be tested when time allows   SENSATION: Eval:  Light touch intact today distally, though diminished around sx area .  She also shows some significant hypersensitivity/allodynia around the scar area  OBSERVATIONS:   Eval: she is overtly hypersensitive and guarded today. She can barely tolerate a light touch, she can also barely move at the [facility] or MCP joints of the right thumb.  IP joint is also extremely stiff (S/P UCL repair)    TODAY'S TREATMENT:  06/09/23: She starts with active range of motion exercises and when measured these things do not show any significant improvement at the thumb yet.  OT reviews her home exercises with her including stretches and the use of dorsal taping and it seems like a more formal, orthotic dynamic flexion orthosis would be appropriate as compared to dorsal taping at this point.  OT modifies her current hand-based brace to include a dynamic strapping component to help passively, dynamically stretch the IP joint of her thumb.  It seems to work well and she can wear without any difficulties and she achieves a good low load long duration stretch.  She should do this similarly dorsal taping and she states understanding. Next, OT uses  manual therapy IASTM to the tight muscles around the thenar eminence and likely [place] sign of in interconnection between the index finger and the [employer].  OT also does manual therapy stretching to the transverse arch of her hand and the longitudinal arches as well as stretches for the thumb at all joints and even encouraging light resistance against the UCL at this point.  This is mildly tender for her but seems to help with her tightness and complaints.  OT does educate that when she begins weaning from her orthosis in the upcoming weeks, this tightness will hopefully fade as well.  At the end of the  session she has less tightness and pain, is encouraged to continue to use modalities, self massages, stretches and also try the new dynamic IP flexion strap with her orthosis.    06/05/23: She starts with AROM for exercise and new measures that show that her motion is significantly improved after starting light and gentle dorsal taping.  For her hypersensitivity, OT has her perform desensitization and fluidotherapy for 6 minutes today concurrently with reaching and grasp of objects hidden in the medium.  She tolerates this well with some "strange" sensations and no significant pains.  She also performs functional activity fine motor skills nine-hole peg test and now is within normal limits doing much better.  OT does manual therapy IASTM around her tight thumb and thenar eminence, after which she states feeling a bit looser.  OT also does manual stretches with her with a review of the stretches to not be painful and only be very gentle for now.  We discuss fnl activities and weaning of her orthosis for light and non-painful tasks, and OT does explain that it's not abnormal to have pain for the first 6-8 weeks, and it's ok to need supportive bracing until 10 or 11 weeks post-op, especially if she's having pain.  We review her HEP and light stretches as well as new dorsal taping technique.  Additionally she is  educated on in hand manipulation fine motor skills "pen tricks."  She leaves in no significant pain stating understanding to use caution still for the next month and use bracing is much as needed.  In-Hand Manipulation Skills Rotation:  Hold pen, try to "twirl" like a baton, keeping parallel (or flat) with surface of table. Try going BOTH directions 10x  Flip:  Hold pen in writing position,  flip in an arch to "erase" position, then back to "write" position. Do not lift hand off table.  10x  Translation:  Open hand palm up,  put an object in your palm and then use your fingers and thumb to move it to the tips of your fingers, pinched against your thumb. (bigger is easier (fat marker), smaller is harder (penny)) 10x  Shift:  Hold pen like a dart, start "shifting" it forward & backwards from tip to base (like putting a key in a key hole) 10x     PATIENT EDUCATION: Education details: See tx section above for details  Person educated: Patient Education method: Verbal Instruction, Teach back, Handouts  Education comprehension: States and demonstrates understanding, Additional Education required    HOME EXERCISE PROGRAM: Access Code: [email or web address] URL: [email or web address] Date: 05/19/2023 Prepared by: Fannie Knee   GOALS: Goals reviewed with patient? Yes   SHORT TERM GOALS: (STG required if POC>30 days) Target Date: 06/06/23  Pt will obtain protective, custom orthotic. Goal status: MET   2.  Pt will demo/state understanding of initial HEP to improve pain levels and prerequisite motion. Goal status: 06/09/23 MET   LONG TERM GOALS: Target Date: 07/04/23  Pt will improve functional ability by decreased impairment per Quick DASH assessment from 54% to <20% or better, for better quality of life. Goal status: INITIAL  2.  Pt will improve grip strength in right hand to at least 35 lbs for functional use at home and in IADLs. Goal status: INITIAL  3.  Pt will  improve A/ROM in right thumb MCP and IP joints flexion from 24* / 16* to at least 45*  / 50* respectively, to have functional motion for tasks like reach and grasp.  Goal  status: INITIAL  4.  Pt will improve strength in right thumb from painful 3 -/5 MMT to at least 4+/5 MMT to have increased functional ability to carry out selfcare and higher-level homecare tasks with less difficulty. Goal status: INITIAL  5.  Pt will improve coordination skills in right hand, as seen by [facility] score on nine-hole peg test testing to have increased functional ability to carry out fine motor tasks (fasteners, etc.) and more complex, coordinated IADLs (meal prep, sports, etc.).  Goal status: INITIAL  6.  Pt will decrease pain at worst from 8/10 to 3/10 or better to have better sleep and occupational participation in daily roles. Goal status: INITIAL   ASSESSMENT:  CLINICAL IMPRESSION: 06/09/23: She continues to be a bit sensitive about her scar but that is a bit better now.  Muscles around the thenar eminence are still a bit tight but hopefully will improve with weaning of the orthosis.  Next week she can start strengthening per postop protocols  06/05/23: She still has some hypersensitivity around her scar and is still fairly stiff and tender around the UCL.  This is not totally abnormal at this point and she was educated on that and also the need to brace as often as needed to prevent pain and exacerbation of swelling or problems.  She has been improving motion, pain, functional activity and we will carry on.    PLAN:  OT FREQUENCY: 1-2x/week  OT DURATION: 6 weeks through 07/04/23 and up to 10 visits   PLANNED INTERVENTIONS: self care/ADL training, therapeutic exercise, therapeutic activity, neuromuscular re-education, manual therapy, scar mobilization, passive range of motion, splinting, electrical stimulation, ultrasound, fluidotherapy, compression bandaging, moist heat, cryotherapy, contrast bath,  patient/family education, coping strategies training, DME and/or AE instructions, Re-evaluation, and Dry needling  RECOMMENDED OTHER SERVICES: none now   CONSULTED AND AGREED WITH PLAN OF CARE: Patient  PLAN FOR NEXT SESSION:   Motion check and begin strengthening as tolerated at 8 weeks postop.  Also discuss functional activities and weaning from her orthosis, consider trimming down her orthosis to allow a full thumb flexion dynamic stretch with a new dynamic strapping that was created today  Fannie Knee, OTR/L, CHT 06/09/2023, 5:59 PM

## 2023-06-09 ENCOUNTER — Encounter: Payer: Self-pay | Admitting: Rehabilitative and Restorative Service Providers"

## 2023-06-09 ENCOUNTER — Ambulatory Visit (INDEPENDENT_AMBULATORY_CARE_PROVIDER_SITE_OTHER): Payer: BC Managed Care – PPO | Admitting: Rehabilitative and Restorative Service Providers"

## 2023-06-09 DIAGNOSIS — R600 Localized edema: Secondary | ICD-10-CM | POA: Diagnosis not present

## 2023-06-09 DIAGNOSIS — M79641 Pain in right hand: Secondary | ICD-10-CM

## 2023-06-09 DIAGNOSIS — M6281 Muscle weakness (generalized): Secondary | ICD-10-CM | POA: Diagnosis not present

## 2023-06-09 DIAGNOSIS — R278 Other lack of coordination: Secondary | ICD-10-CM

## 2023-06-09 DIAGNOSIS — M25641 Stiffness of right hand, not elsewhere classified: Secondary | ICD-10-CM

## 2023-06-11 ENCOUNTER — Encounter: Payer: BC Managed Care – PPO | Admitting: Rehabilitative and Restorative Service Providers"

## 2023-06-18 ENCOUNTER — Encounter: Payer: BC Managed Care – PPO | Admitting: Rehabilitative and Restorative Service Providers"

## 2023-06-23 ENCOUNTER — Encounter: Payer: BC Managed Care – PPO | Admitting: Orthopedic Surgery

## 2023-06-25 ENCOUNTER — Encounter: Payer: BC Managed Care – PPO | Admitting: Rehabilitative and Restorative Service Providers"

## 2023-06-26 NOTE — Therapy (Signed)
OUTPATIENT OCCUPATIONAL THERAPY TREATMENT NOTE   Patient Name: Tara Rios MRN: [id number] DOB:Sep 03, 1970, 52 y.o., female Today's Date: 06/27/2023  PCP: Mirna Mires, MD REFERRING PROVIDER:  Samuella Cota, MD     END OF SESSION:  OT End of Session - 06/27/23 1120     Visit Number 8    Number of Visits 10    Date for OT Re-Evaluation 07/04/23    Authorization Type BCBS    OT Start Time 1120    OT Stop Time 1149    OT Time Calculation (min) 29 min    Equipment Utilized During Treatment yellow putty    Activity Tolerance Patient tolerated treatment well;Patient limited by fatigue;Patient limited by pain;No increased pain    Behavior During Therapy WFL for tasks assessed/performed               Past Medical History:  Diagnosis Date   Hypertension    Past Surgical History:  Procedure Laterality Date   TONSILLECTOMY     ULNAR COLLATERAL LIGAMENT REPAIR Right 04/22/2023   Procedure: RIGHT THUMB ULNAR COLLATERAL LIGAMENT RECONSTRUCTION;  Surgeon: Samuella Cota, MD;  Location: [facility];  Service: Orthopedics;  Laterality: Right;  regional block   Patient Active Problem List   Diagnosis Date Noted   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 joint of thumb 04/22/2023   Abnormal Pap smear of cervix 10/21/2017   Trichomonas infection 05/15/2017   Hypertension    BURSITIS, OLECRANON 03/13/2009    ONSET DATE: DOS 04/22/23  REFERRING DIAG: A21.308M (ICD-10-CM) - 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 joint of thumb   THERAPY DIAG:  Localized edema  Stiffness of right hand, not elsewhere classified  Other lack of coordination  Muscle weakness (generalized)  Pain in right hand  Rationale for Evaluation and Treatment: Rehabilitation  PERTINENT HISTORY: Per MD notes: "Splinting appointment and initiate hand therapy; S/P UCL repair"  She states falling and thinking she broke her thumb, but eventually  got MRI showing UCL tear. She had repair and has had fairly high ain reports and guarding from pain since.  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   PRECAUTIONS: None;  RED FLAGS: None ; WEIGHT BEARING RESTRICTIONS: Yes no lifting until week 8 post-op     SUBJECTIVE:   SUBJECTIVE STATEMENT: She is 9 weeks post Rt thumb UCL repair.  She returns after ~2 weeks of self-management, states she missed the past 2 weeks due to family illness and traveling, also an unexpected work event that caused her to miss.  She has been doing well though and arrives wearing prefabricated compression glove versus a rigid orthosis and states tolerating weaning to this now.  Very little pain at rest.  She arrived late today due to a traffic jam.    PAIN:  Are you having pain?  2/10 now in Rt thumb    PATIENT GOALS: To safely improve the use of her right dominant hand-wrist and thumb  NEXT MD VISIT: 06/13/23    OBJECTIVE: (All objective assessments below are from initial evaluation on: 05/19/23 unless otherwise specified.)   HAND DOMINANCE: Right   ADLs: Overall ADLs: States decreased ability to grab, hold household objects, pain and difficulty to open containers, perform FMS tasks (manipulate fasteners on clothing), mild to moderate bathing problems as well.    FUNCTIONAL OUTCOME MEASURES: 07/02/23: Quick DASH: TBD% impairment today   05/21/23: Quick DASH: 54.5% impairment today    UPPER EXTREMITY  ROM     Shoulder to Wrist AROM Right eval Rt 06/09/23 Rt 07/02/23  Forearm supination ~75    Forearm pronation  ~75    Wrist flexion 65 76   Wrist extension 48 52 0 - TBD  (Blank rows = not tested)   Hand AROM Right eval Rt 05/26/23 Rt 06/09/23 Rt 07/02/23  Full Fist Ability (or Gap to Distal Palmar Crease) 2cm gap to full fist from tips of fingers to [facility]     Thumb Opposition  (Kapandji Scale)  1/10 6 /10    Thumb MCP (0-60) 0- 24 0 - 41 0 - 47 0 - TBD  Thumb IP (0-80) 0-  16  0 - 21 0 - 24   Thumb Radial Abduction Span      Thumb Palmar Abduction Span      (Blank rows = not tested)   UPPER EXTREMITY MMT:     MMT Right 07/02/23  Elbow flexion   Elbow extension   Forearm supination   Forearm pronation   Wrist flexion   Wrist extension   Wrist ulnar deviation   Wrist radial deviation   (Blank rows = not tested)  HAND FUNCTION: 06/27/23: Grip strength Right: 24 lbs, Left: 53 lbs    COORDINATION: 06/05/23: 9HPT Rt: 20.7sec today (WNL)   05/21/23: 9 Hole Peg Test Right: 1 min 36sec, (approx 22 sec is WFL)   Eval: Observed coordination impairments with affected Rt hand.  Details will be tested when time allows   SENSATION: Eval:  Light touch intact today distally, though diminished around sx area .  She also shows some significant hypersensitivity/allodynia around the scar area  OBSERVATIONS:   07/02/23: TBD  Eval: she is overtly hypersensitive and guarded today. She can barely tolerate a light touch, she can also barely move at the [facility] or MCP joints of the right thumb.  IP joint is also extremely stiff (S/P UCL repair)    TODAY'S TREATMENT:  06/27/23: As she arrives late today OT cuts to the new "meat and potatoes."  She uses a moist heat pad to warm up for 3 minutes while OT is educating her and updating her home exercise program for new hand strengthening now 9 weeks postop.  After 3 minutes of heat she states feeling better and looser, then performs wrist and thumb stretches for review and exercises.  After that, OT supplies her with therapy putty that is yellow (light) and educates her on several activities as bolded below to increase hand strength and functional endurance and tolerance.  Each 1 was demonstrated to her and explained to her with her demonstration back and she has no significant pain, only some pulling and tension through her hand and thumb.  She is asked to do these carefully, progressively, not painfully.  She states understanding  and leaves with no significant pain   Exercises - Wrist Prayer Stretch  - 4 x daily - 3-5 reps - 15 sec hold - Wrist Flexion Stretch  - 4 x daily - 3-5 reps - 15 sec hold - Seated Finger Composite Flexion Stretch  - 4 x daily - 3-5 reps - 15 hold - Thumb stretch  - 4 x daily - 3-5 reps - 15-20 sec hold - Full Fist  - 2-3 x daily - 5 reps - Finger Extension "Pizza!"   - 2-3 x daily - 5 reps - Thumb Press  - 2-3 x daily - 5 reps - Thumb Opposition with Putty  - 2-3  x daily - 5 reps   06/09/23: She starts with active range of motion exercises and when measured these things do not show any significant improvement at the thumb yet.  OT reviews her home exercises with her including stretches and the use of dorsal taping and it seems like a more formal, orthotic dynamic flexion orthosis would be appropriate as compared to dorsal taping at this point.  OT modifies her current hand-based brace to include a dynamic strapping component to help passively, dynamically stretch the IP joint of her thumb.  It seems to work well and she can wear without any difficulties and she achieves a good low load long duration stretch.  She should do this similarly dorsal taping and she states understanding. Next, OT uses manual therapy IASTM to the tight muscles around the thenar eminence and likely [place] sign of in interconnection between the index finger and the [employer].  OT also does manual therapy stretching to the transverse arch of her hand and the longitudinal arches as well as stretches for the thumb at all joints and even encouraging light resistance against the UCL at this point.  This is mildly tender for her but seems to help with her tightness and complaints.  OT does educate that when she begins weaning from her orthosis in the upcoming weeks, this tightness will hopefully fade as well.  At the end of the session she has less tightness and pain, is encouraged to continue to use modalities, self massages, stretches  and also try the new dynamic IP flexion strap with her orthosis.  PATIENT EDUCATION: Education details: See tx section above for details  Person educated: Patient Education method: Verbal Instruction, Teach back, Handouts  Education comprehension: States and demonstrates understanding, Additional Education required    HOME EXERCISE PROGRAM: Access Code: [email or web address] URL: [email or web address] Date: 05/19/2023 Prepared by: Fannie Knee   GOALS: Goals reviewed with patient? Yes   SHORT TERM GOALS: (STG required if POC>30 days) Target Date: 06/06/23  Pt will obtain protective, custom orthotic. Goal status: MET   2.  Pt will demo/state understanding of initial HEP to improve pain levels and prerequisite motion. Goal status: 06/09/23 MET   LONG TERM GOALS: Target Date: 07/04/23  Pt will improve functional ability by decreased impairment per Quick DASH assessment from 54% to <20% or better, for better quality of life. Goal status: 07/02/23: TBD  2.  Pt will improve grip strength in right hand to at least 35 lbs for functional use at home and in IADLs. Goal status: 07/02/23: TBD  3.  Pt will improve A/ROM in right thumb MCP and IP joints flexion from 24* / 16* to at least 45*  / 50* respectively, to have functional motion for tasks like reach and grasp.  Goal status: 07/02/23: TBD  4.  Pt will improve strength in right thumb from painful 3 -/5 MMT to at least 4+/5 MMT to have increased functional ability to carry out selfcare and higher-level homecare tasks with less difficulty. Goal status: 07/02/23: TBD  5.  Pt will improve coordination skills in right hand, as seen by [facility] score on nine-hole peg test testing to have increased functional ability to carry out fine motor tasks (fasteners, etc.) and more complex, coordinated IADLs (meal prep, sports, etc.).  Goal status: 07/02/23: TBD  6.  Pt will decrease pain at worst from 8/10 to 3/10 or better to have better sleep  and occupational participation in daily roles. Goal status: 07/02/23: TBD   ASSESSMENT:  CLINICAL IMPRESSION: 06/27/23: At 9 weeks postop she has very little pain, is wearing a compressive glove versus a rigid orthosis, and now tolerating hand strength.  Carry on next week for progress note and likely discharge if all goals are met.   06/09/23: She continues to be a bit sensitive about her scar but that is a bit better now.  Muscles around the thenar eminence are still a bit tight but hopefully will improve with weaning of the orthosis.  Next week she can start strengthening per postop protocols  06/05/23: She still has some hypersensitivity around her scar and is still fairly stiff and tender around the UCL.  This is not totally abnormal at this point and she was educated on that and also the need to brace as often as needed to prevent pain and exacerbation of swelling or problems.  She has been improving motion, pain, functional activity and we will carry on.    PLAN:  OT FREQUENCY: 1-2x/week  OT DURATION: 6 weeks through 07/04/23 and up to 10 visits   PLANNED INTERVENTIONS: self care/ADL training, therapeutic exercise, therapeutic activity, neuromuscular re-education, manual therapy, scar mobilization, passive range of motion, splinting, electrical stimulation, ultrasound, fluidotherapy, compression bandaging, moist heat, cryotherapy, contrast bath, patient/family education, coping strategies training, DME and/or AE instructions, Re-evaluation, and Dry needling  RECOMMENDED OTHER SERVICES: none now   CONSULTED AND AGREED WITH PLAN OF CARE: Patient  PLAN FOR NEXT SESSION:   Check new hand strength perform progress note possible discharge  Fannie Knee, OTR/L, CHT 06/27/2023, 11:54 AM

## 2023-06-27 ENCOUNTER — Encounter: Payer: Self-pay | Admitting: Rehabilitative and Restorative Service Providers"

## 2023-06-27 ENCOUNTER — Ambulatory Visit (INDEPENDENT_AMBULATORY_CARE_PROVIDER_SITE_OTHER): Payer: BC Managed Care – PPO | Admitting: Rehabilitative and Restorative Service Providers"

## 2023-06-27 DIAGNOSIS — R600 Localized edema: Secondary | ICD-10-CM | POA: Diagnosis not present

## 2023-06-27 DIAGNOSIS — M6281 Muscle weakness (generalized): Secondary | ICD-10-CM | POA: Diagnosis not present

## 2023-06-27 DIAGNOSIS — M25641 Stiffness of right hand, not elsewhere classified: Secondary | ICD-10-CM

## 2023-06-27 DIAGNOSIS — M79641 Pain in right hand: Secondary | ICD-10-CM

## 2023-06-27 DIAGNOSIS — R278 Other lack of coordination: Secondary | ICD-10-CM

## 2023-06-30 NOTE — Therapy (Signed)
OUTPATIENT OCCUPATIONAL THERAPY TREATMENT & discharge NOTE   Patient Name: Tara Rios MRN: [id number] DOB:1971-02-06, 52 y.o., female Today's Date: 07/02/2023  PCP: Mirna Mires, MD REFERRING PROVIDER:  Samuella Cota, MD       OCCUPATIONAL THERAPY DISCHARGE SUMMARY  Visits from Start of Care: 9  Current functional level related to goals / functional outcomes: 07/02/23: She is now met all her goals to therapist and patient's satisfaction, having little to no pain now, is tolerating lifting weights and squeezing up to 50 pounds without any significant problems.  She still has a bit of anxiety about using her hand and she was coached that as long as she does not have pain she cannot hurt herself.  She can start to wean from all braces within the next 2 weeks as tolerated and she also has a dynamic progressive orthosis to help improve her IP joint motion if needed and desired to be used.  She states understanding all directions, HEP and will continue on and is happy to discharge today.  Fannie Knee, OTR/L, CHT 07/02/23      END OF SESSION:  OT End of Session - 07/02/23 1609     Visit Number 9    Number of Visits 10    Date for OT Re-Evaluation 07/04/23    Authorization Type [employer]    OT Start Time 1607    OT Stop Time 1650    OT Time Calculation (min) 43 min    Equipment Utilized During Treatment green putty    Activity Tolerance Patient tolerated treatment well;Patient limited by fatigue;No increased pain    Behavior During Therapy WFL for tasks assessed/performed                Past Medical History:  Diagnosis Date   Hypertension    Past Surgical History:  Procedure Laterality Date   TONSILLECTOMY     ULNAR COLLATERAL LIGAMENT REPAIR Right 04/22/2023   Procedure: RIGHT THUMB ULNAR COLLATERAL LIGAMENT RECONSTRUCTION;  Surgeon: Samuella Cota, MD;  Location: [facility];  Service: Orthopedics;  Laterality: Right;  regional block   Patient  Active Problem List   Diagnosis Date Noted   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 joint of thumb 04/22/2023   Abnormal Pap smear of cervix 10/21/2017   Trichomonas infection 05/15/2017   Hypertension    BURSITIS, OLECRANON 03/13/2009    ONSET DATE: DOS 04/22/23  REFERRING DIAG: X32.440N (ICD-10-CM) - 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 joint of thumb   THERAPY DIAG:  Stiffness of right hand, not elsewhere classified  Other lack of coordination  Pain in right hand  Muscle weakness (generalized)  Rationale for Evaluation and Treatment: Rehabilitation  PERTINENT HISTORY: Per MD notes: "Splinting appointment and initiate hand therapy; S/P UCL repair"  She states falling and thinking she broke her thumb, but eventually got MRI showing UCL tear. She had repair and has had fairly high ain reports and guarding from pain since.  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   PRECAUTIONS: None;  RED FLAGS: None ; WEIGHT BEARING RESTRICTIONS: Yes no lifting until week 8 post-op     SUBJECTIVE:   SUBJECTIVE STATEMENT: She is 10 weeks post Rt thumb UCL repair.  She states having no significant problems at home or at work now, still some apprehension and avoidance of using her right hand, but it is improving.  She is also tolerating strengthening with putty very well.Marland Kitchen  PAIN:  Are you having pain?  None now after tylenol   PATIENT GOALS: To safely improve the use of her right dominant hand-wrist and thumb  NEXT MD VISIT: 06/13/23    OBJECTIVE: (All objective assessments below are from initial evaluation on: 05/19/23 unless otherwise specified.)   HAND DOMINANCE: Right   ADLs: Overall ADLs: States decreased ability to grab, hold household objects, pain and difficulty to open containers, perform FMS tasks (manipulate fasteners on clothing), mild to moderate bathing problems as well.    FUNCTIONAL OUTCOME  MEASURES: 07/02/23: Quick DASH: 22% impairment today   05/21/23: Quick DASH: 54.5% impairment today    UPPER EXTREMITY ROM     Shoulder to Wrist AROM Right eval Rt 06/09/23 Rt 07/02/23  Forearm supination ~75  90  Forearm pronation  ~75  80  Wrist flexion 65 76 78  Wrist extension 48 52 0 - 52  (Blank rows = not tested)   Hand AROM Right eval Rt 05/26/23 Rt 06/09/23 Rt 07/02/23  Full Fist Ability (or Gap to Distal Palmar Crease) 2cm gap to full fist from tips of fingers to [facility]   Tight, full fist  Thumb Opposition  (Kapandji Scale)  1/10 6 /10  9/10  Thumb MCP (0-60) 0- 24 0 - 41 0 - 47 0 - 52  Thumb IP (0-80) 0- 16  0 - 21 0 - 24 0 - 40  (Blank rows = not tested)   UPPER EXTREMITY MMT:     MMT Right 07/02/23  Elbow flexion 5/5  Elbow extension 5/5  Forearm supination   Forearm pronation   Wrist flexion 5/5  Wrist extension 5/5  Wrist ulnar deviation   Wrist radial deviation   (Blank rows = not tested)  HAND FUNCTION: 07/02/23: Grip strength Right: 54 lbs  06/27/23: Grip strength Right: 24 lbs, Left: 53 lbs    COORDINATION: 06/05/23: 9HPT Rt: 20.7sec today (WNL)   05/21/23: 9 Hole Peg Test Right: 1 min 36sec, (approx 22 sec is WFL)   Eval: Observed coordination impairments with affected Rt hand.  Details will be tested when time allows   SENSATION: 07/02/23: still numb to scar area, but tip of thumb is WNL, and all elsewhere  Eval:  Light touch intact today distally, though diminished around sx area .  She also shows some significant hypersensitivity/allodynia around the scar area   TODAY'S TREATMENT:  07/02/23: Pt performs AROM, gripping, and strength with Rt against therapist's resistance for exercise/activities as well as new measures today. OT also discusses home and functional tasks with the pt and reviews goals. Using the complied data, OT also reviews home exercises and provides final recommendations and upgrades, including upgrading putty from yellow to  green (this is the most firm).  Additionally she performs advanced progressive resistive exercises with therapist in the session today including curls with 5 pound dumbbells, tricep presses with a cable machine and 20 pounds, seated rows with 25 pounds, and chest presses with 10 pounds.  She tolerates these well in a booster confidence for functional activities.  OT does review to withhold any repetitive and very forceful sports activities for at least another 2 weeks but she should build up to this tolerance point.  Pt states understanding and tolerates upgrades well.    Exercises - Wrist Prayer Stretch  - 4 x daily - 3-5 reps - 15 sec hold - Wrist Flexion Stretch  - 4 x daily - 3-5 reps - 15 sec hold - Seated Finger Composite  Flexion Stretch  - 4 x daily - 3-5 reps - 15 hold - Thumb stretch  - 4 x daily - 3-5 reps - 15-20 sec hold - Full Fist  - 2-3 x daily - 5 reps - Finger Extension "Pizza!"   - 2-3 x daily - 5 reps - Thumb Press  - 2-3 x daily - 5 reps - Thumb Opposition with Putty  - 2-3 x daily - 5 reps   PATIENT EDUCATION: Education details: See tx section above for details  Person educated: Patient Education method: Verbal Instruction, Teach back, Handouts  Education comprehension: States and demonstrates understanding   HOME EXERCISE PROGRAM: Access Code: [email or web address] URL: [email or web address] Date: 05/19/2023 Prepared by: Fannie Knee   GOALS: Goals reviewed with patient? Yes   SHORT TERM GOALS: (STG required if POC>30 days) Target Date: 06/06/23  Pt will obtain protective, custom orthotic. Goal status: MET   2.  Pt will demo/state understanding of initial HEP to improve pain levels and prerequisite motion. Goal status: 06/09/23 MET   LONG TERM GOALS: Target Date: 07/04/23  Pt will improve functional ability by decreased impairment per Quick DASH assessment from 54% to <20% or better, for better quality of life. Goal status: 07/02/23: Goal  considered met now 22% impairment and expected to improve over the next 2 weeks without any more therapeutic intervention.  2.  Pt will improve grip strength in right hand to at least 35 lbs for functional use at home and in IADLs. Goal status: 07/02/23: Goal met now 50 pounds plus  3.  Pt will improve A/ROM in right thumb MCP and IP joints flexion from 24* / 16* to at least 45*  / 50* respectively, to have functional motion for tasks like reach and grasp.  Goal status: 07/02/23: Considered met now 52*/40* and will continue to work on IP J motion  4.  Pt will improve strength in right thumb from painful 3 -/5 MMT to at least 4+/5 MMT to have increased functional ability to carry out selfcare and higher-level homecare tasks with less difficulty. Goal status: 07/02/23: Goal met  5.  Pt will improve coordination skills in right hand, as seen by [facility] score on nine-hole peg test testing to have increased functional ability to carry out fine motor tasks (fasteners, etc.) and more complex, coordinated IADLs (meal prep, sports, etc.).  Goal status: 07/02/23: Goal met  6.  Pt will decrease pain at worst from 8/10 to 3/10 or better to have better sleep and occupational participation in daily roles. Goal status: 07/02/23: Goal met   ASSESSMENT:  CLINICAL IMPRESSION: 07/02/23: She is now met all her goals to therapist and patient's satisfaction, having little to no pain now, is tolerating lifting weights and squeezing up to 50 pounds without any significant problems.  She still has a bit of anxiety about using her hand and she was coached that as long as she does not have pain she cannot hurt herself.  She can start to wean from all braces within the next 2 weeks as tolerated and she also has a dynamic progressive orthosis to help improve her IP joint motion if needed and desired to be used.  She states understanding all directions, HEP and will continue on and is happy to discharge today.   PLAN:  OT  FREQUENCY:& OT DURATION: D/C now  PLANNED INTERVENTIONS: self care/ADL training, therapeutic exercise, therapeutic activity, neuromuscular re-education, manual therapy, scar mobilization, passive range of motion, splinting, electrical stimulation, ultrasound, fluidotherapy, compression bandaging, moist  heat, cryotherapy, contrast bath, patient/family education, coping strategies training, DME and/or AE instructions, Re-evaluation, and Dry needling  RECOMMENDED OTHER SERVICES: none now   CONSULTED AND AGREED WITH PLAN OF CARE: Patient  PLAN FOR NEXT SESSION:   N/a, D/C   Fannie Knee, OTR/L, CHT 07/02/2023, 5:07 PM

## 2023-07-02 ENCOUNTER — Ambulatory Visit (INDEPENDENT_AMBULATORY_CARE_PROVIDER_SITE_OTHER): Payer: BC Managed Care – PPO | Admitting: Rehabilitative and Restorative Service Providers"

## 2023-07-02 ENCOUNTER — Encounter: Payer: Self-pay | Admitting: Rehabilitative and Restorative Service Providers"

## 2023-07-02 DIAGNOSIS — R278 Other lack of coordination: Secondary | ICD-10-CM

## 2023-07-02 DIAGNOSIS — M79641 Pain in right hand: Secondary | ICD-10-CM

## 2023-07-02 DIAGNOSIS — M25641 Stiffness of right hand, not elsewhere classified: Secondary | ICD-10-CM | POA: Diagnosis not present

## 2023-07-02 DIAGNOSIS — M6281 Muscle weakness (generalized): Secondary | ICD-10-CM

## 2023-07-28 ENCOUNTER — Ambulatory Visit (INDEPENDENT_AMBULATORY_CARE_PROVIDER_SITE_OTHER): Payer: BC Managed Care – PPO | Admitting: Orthopedic Surgery

## 2023-07-28 DIAGNOSIS — S63621A Sprain of interphalangeal joint of right thumb, initial encounter: Secondary | ICD-10-CM

## 2023-07-28 DIAGNOSIS — S63629A Sprain of interphalangeal joint of unspecified thumb, initial encounter: Secondary | ICD-10-CM

## 2023-07-28 NOTE — Progress Notes (Signed)
   Tara Rios - 52 y.o. female MRN [id number]  Date of birth: Nov 18, 1970  Office Visit Note: Visit Date: 07/28/2023 PCP: Mirna Mires, MD Referred by: Mirna Mires, MD  Subjective:  HPI: Tara Rios is a 52 y.o. female who presents today for follow up 14 weeks status post right thumb UCL reconstruction.  She is doing well overall, has been discharged from occupational therapy.  States that she does have occasional hypersensitivity in the region.  She has returned to try to utilize this for daily activities.  Pertinent ROS were reviewed with the patient and found to be negative unless otherwise specified above in HPI.   Assessment & Plan: Visit Diagnoses: No diagnosis found.  Plan: She is doing well postoperatively and has reached all of her postoperative goals.  Her thumb range of motion is very good, her stability is excellent and her thumb pinch strength is equal to the contralateral side.  She should continue utilizing the thumb for activities as needed.  We did discuss the ongoing hypersensitivity as well as the potential timeline to allow the resolution.  She expressed understanding.  She will return as needed moving forward.  Follow-up: No follow-ups on file.   Meds & Orders: No orders of the defined types were placed in this encounter.  No orders of the defined types were placed in this encounter.    Procedures: No procedures performed       Objective:   Vital Signs: There were no vitals taken for this visit.  Ortho Exam Left hand: - Well-healed incision over the thumb ulnar collateral ligament region, skin edges well-approximated without erythema or drainage - Stable to stress testing at both neutral and 30 degrees of flexion at MCP - Sensation intact distally, thumb is warm well-perfused - Thumb pinch strength right 20, left 22  Imaging: No results found.   Marcelline Temkin Trevor Mace, M.D. [facility] 9:42 AM

## 2024-06-28 ENCOUNTER — Encounter: Payer: Self-pay | Admitting: Radiology
